# Patient Record
Sex: Female | Born: 1985 | Race: Black or African American | Hispanic: No | Marital: Single | Smoking: Never smoker
Health system: Southern US, Community
[De-identification: ages and names within clinical notes are randomized; demographics above are authoritative.]

## PROBLEM LIST (undated history)

## (undated) DIAGNOSIS — O24419 Gestational diabetes mellitus in pregnancy, unspecified control: Secondary | ICD-10-CM

## (undated) HISTORY — PX: NO PAST SURGERIES: SHX2092

---
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
## 2007-08-29 ENCOUNTER — Emergency Department (HOSPITAL_COMMUNITY): Admission: EM | Admit: 2007-08-29 | Discharge: 2007-08-29 | Payer: Self-pay | Admitting: Emergency Medicine

## 2007-11-02 ENCOUNTER — Ambulatory Visit (HOSPITAL_COMMUNITY): Admission: RE | Admit: 2007-11-02 | Discharge: 2007-11-02 | Payer: Self-pay | Admitting: Family Medicine

## 2007-12-01 ENCOUNTER — Ambulatory Visit (HOSPITAL_COMMUNITY): Admission: RE | Admit: 2007-12-01 | Discharge: 2007-12-01 | Payer: Self-pay | Admitting: Family Medicine

## 2007-12-08 ENCOUNTER — Ambulatory Visit (HOSPITAL_COMMUNITY): Admission: RE | Admit: 2007-12-08 | Discharge: 2007-12-08 | Payer: Self-pay | Admitting: Family Medicine

## 2007-12-08 ENCOUNTER — Telehealth (INDEPENDENT_AMBULATORY_CARE_PROVIDER_SITE_OTHER): Payer: Self-pay | Admitting: Family Medicine

## 2008-02-21 ENCOUNTER — Encounter: Admission: RE | Admit: 2008-02-21 | Discharge: 2008-04-10 | Payer: Self-pay | Admitting: Obstetrics & Gynecology

## 2008-02-21 ENCOUNTER — Ambulatory Visit: Payer: Self-pay | Admitting: Obstetrics & Gynecology

## 2008-02-22 ENCOUNTER — Inpatient Hospital Stay (HOSPITAL_COMMUNITY): Admission: AD | Admit: 2008-02-22 | Discharge: 2008-02-22 | Payer: Self-pay | Admitting: Gynecology

## 2008-02-28 ENCOUNTER — Ambulatory Visit: Payer: Self-pay | Admitting: Obstetrics & Gynecology

## 2008-03-06 ENCOUNTER — Ambulatory Visit (HOSPITAL_COMMUNITY): Admission: RE | Admit: 2008-03-06 | Discharge: 2008-03-06 | Payer: Self-pay | Admitting: Family Medicine

## 2008-03-06 ENCOUNTER — Ambulatory Visit: Payer: Self-pay | Admitting: Obstetrics & Gynecology

## 2008-03-13 ENCOUNTER — Ambulatory Visit: Payer: Self-pay | Admitting: Obstetrics & Gynecology

## 2008-03-20 ENCOUNTER — Ambulatory Visit: Payer: Self-pay | Admitting: *Deleted

## 2008-03-27 ENCOUNTER — Ambulatory Visit: Payer: Self-pay | Admitting: Obstetrics & Gynecology

## 2008-04-03 ENCOUNTER — Ambulatory Visit: Payer: Self-pay | Admitting: Family Medicine

## 2008-04-10 ENCOUNTER — Ambulatory Visit: Payer: Self-pay | Admitting: Obstetrics & Gynecology

## 2008-04-17 ENCOUNTER — Ambulatory Visit: Payer: Self-pay | Admitting: Obstetrics & Gynecology

## 2008-04-24 ENCOUNTER — Encounter: Admission: RE | Admit: 2008-04-24 | Discharge: 2008-04-24 | Payer: Self-pay | Admitting: Obstetrics & Gynecology

## 2008-04-24 ENCOUNTER — Ambulatory Visit: Payer: Self-pay | Admitting: Obstetrics & Gynecology

## 2008-04-25 ENCOUNTER — Ambulatory Visit (HOSPITAL_COMMUNITY): Admission: RE | Admit: 2008-04-25 | Discharge: 2008-04-25 | Payer: Self-pay | Admitting: Obstetrics and Gynecology

## 2008-04-30 ENCOUNTER — Ambulatory Visit: Payer: Self-pay | Admitting: Obstetrics & Gynecology

## 2008-04-30 ENCOUNTER — Observation Stay (HOSPITAL_COMMUNITY): Admission: AD | Admit: 2008-04-30 | Discharge: 2008-04-30 | Payer: Self-pay | Admitting: Obstetrics & Gynecology

## 2008-05-01 ENCOUNTER — Ambulatory Visit: Payer: Self-pay | Admitting: Obstetrics and Gynecology

## 2008-05-01 ENCOUNTER — Inpatient Hospital Stay (HOSPITAL_COMMUNITY): Admission: AD | Admit: 2008-05-01 | Discharge: 2008-05-04 | Payer: Self-pay | Admitting: Obstetrics & Gynecology

## 2008-05-02 ENCOUNTER — Encounter: Payer: Self-pay | Admitting: Obstetrics & Gynecology

## 2008-11-11 ENCOUNTER — Emergency Department (HOSPITAL_COMMUNITY): Admission: EM | Admit: 2008-11-11 | Discharge: 2008-11-11 | Payer: Self-pay | Admitting: Emergency Medicine

## 2009-09-03 IMAGING — CR DG HAND COMPLETE 3+V*R*
3 series · 3 of 3 positions shown · non-contrast
Comparison: none

CLINICAL DATA: Right hand pain. No injury.

RIGHT HAND - 3 VIEW

[view not recorded (1 of 3)]
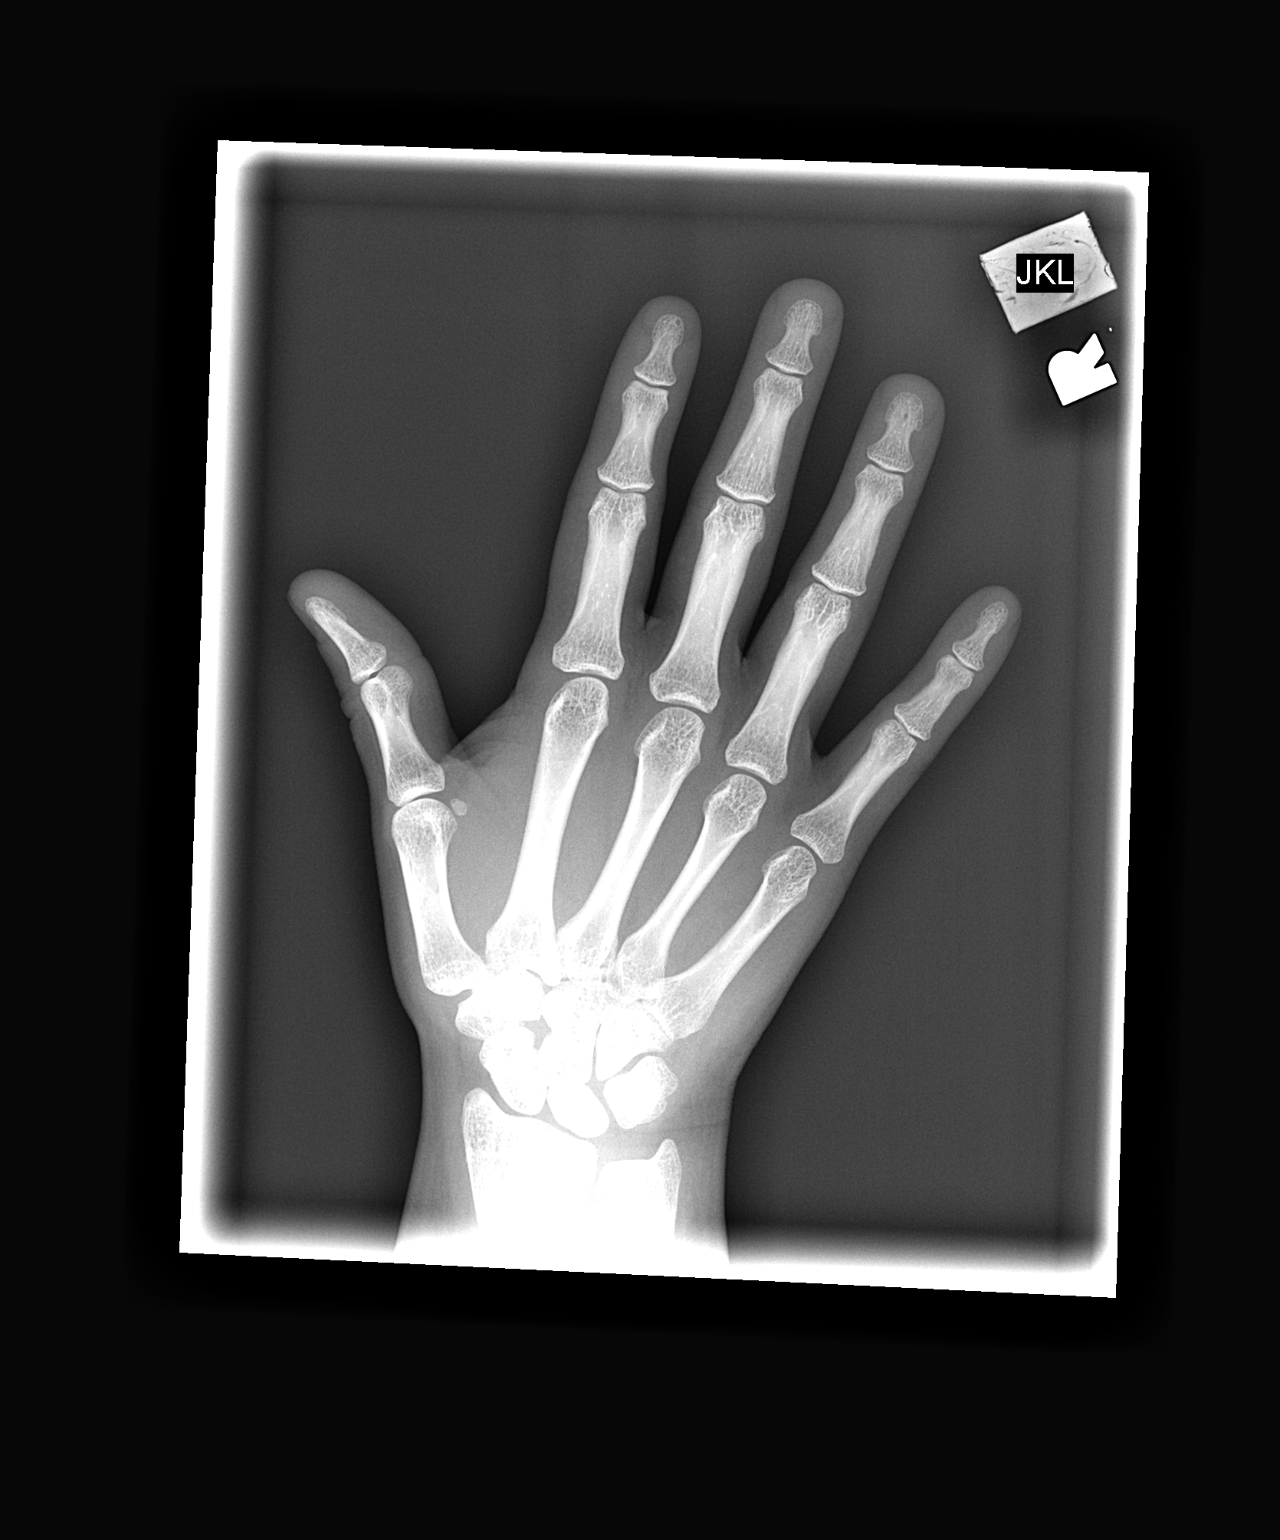

[view not recorded (2 of 3)]
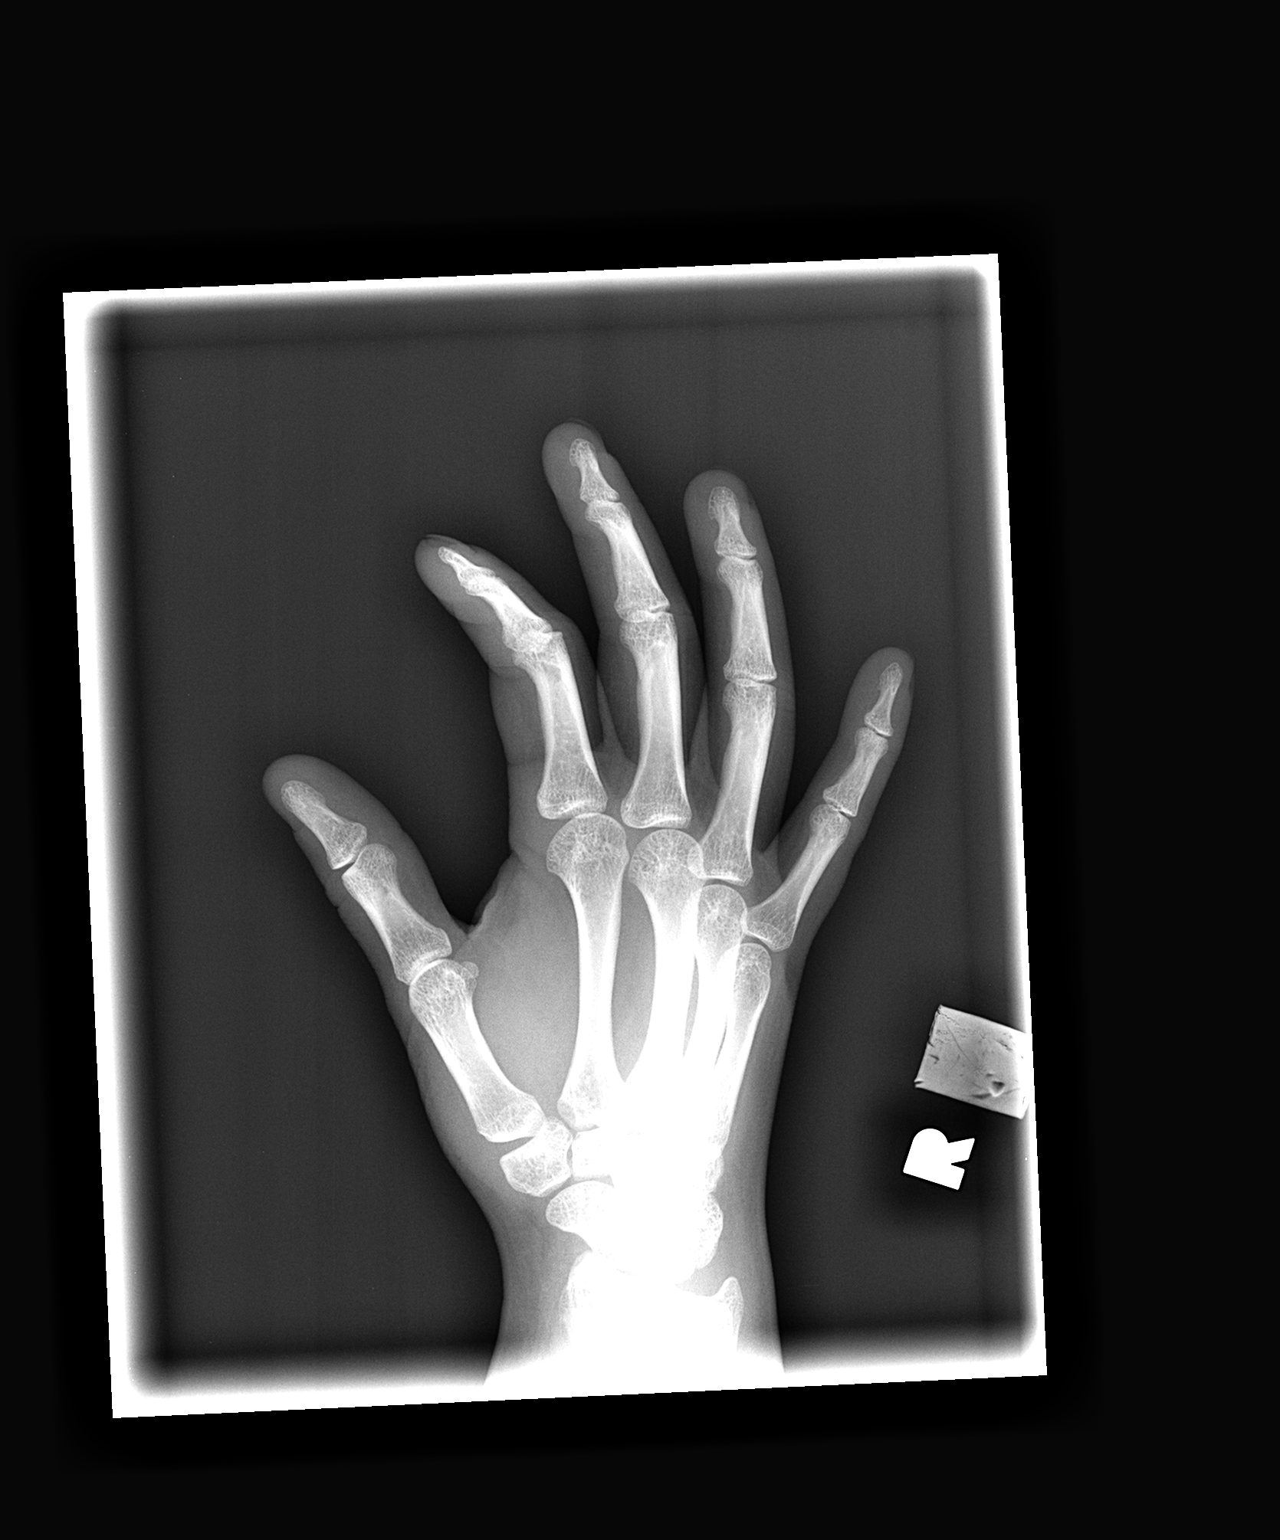

[view not recorded (3 of 3)]
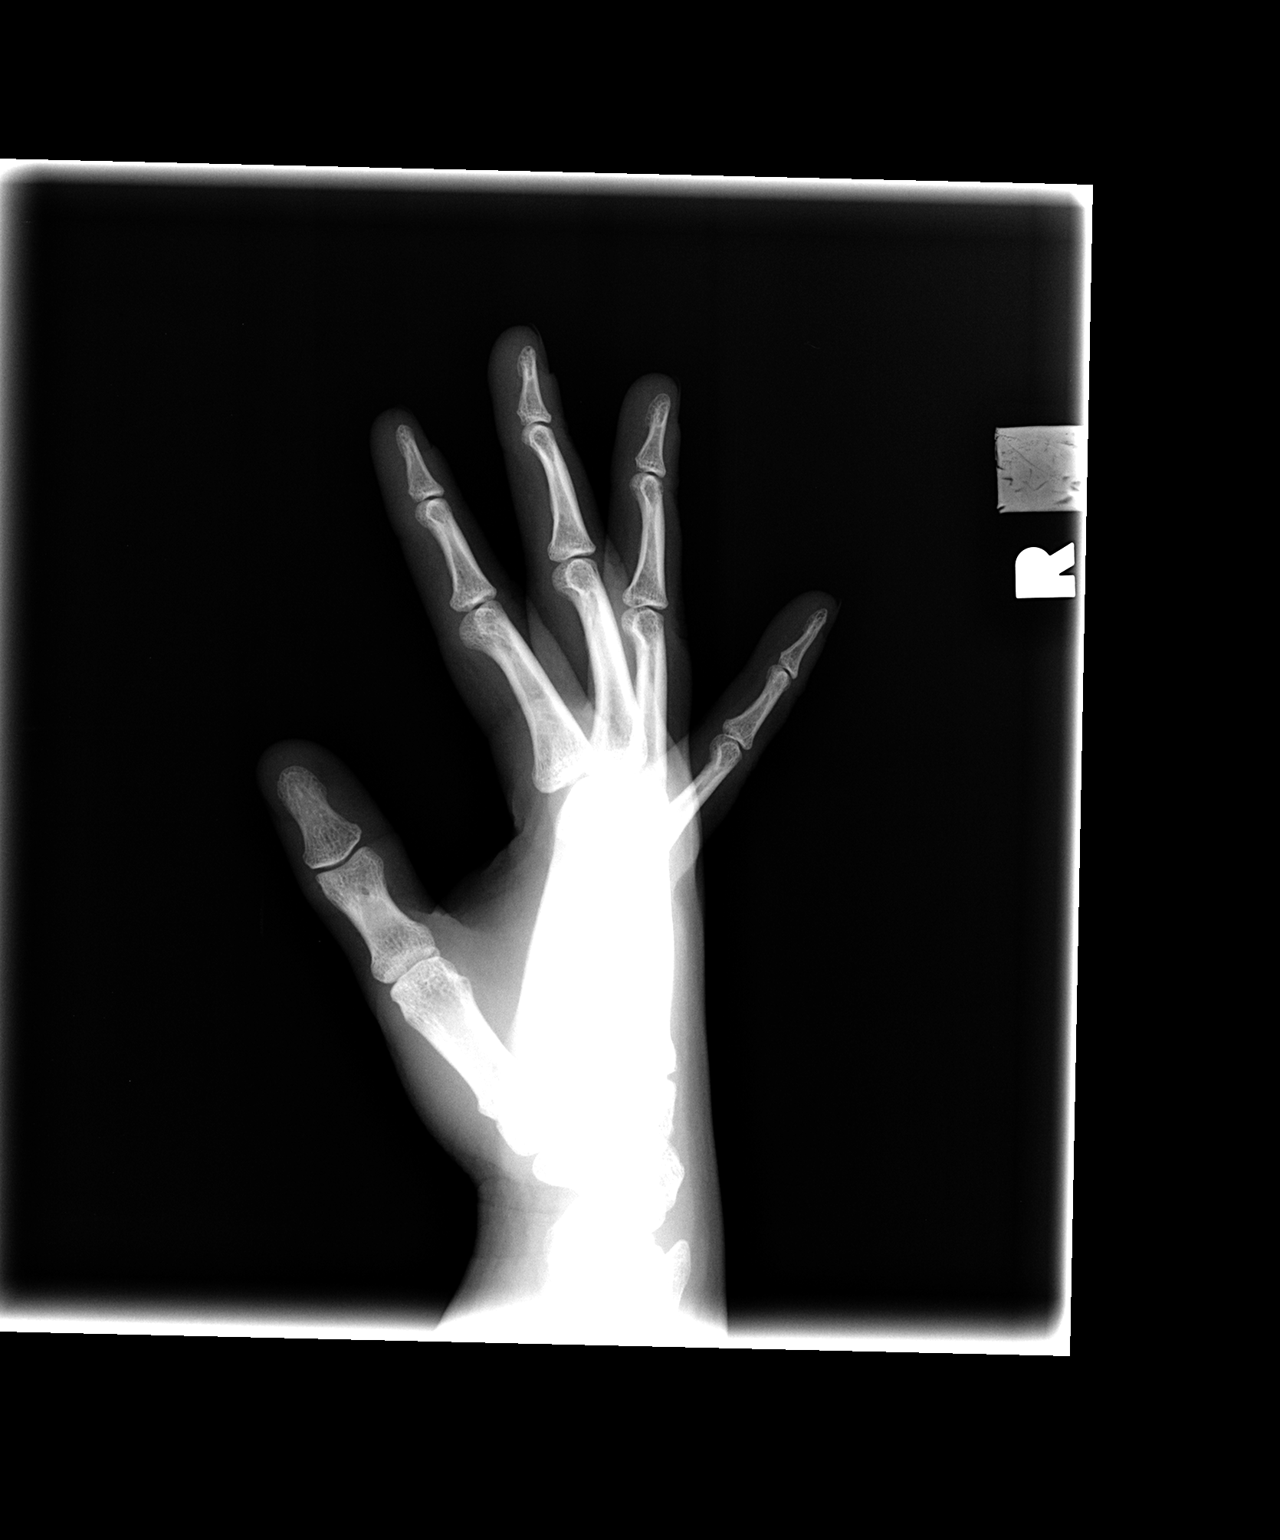

[3 of 3 positions shown; findings below may reference images not displayed]

FINDINGS: No fracture, dislocation, or acute bony findings are identified. No
foreign body noted. No significant arthropathy or findings identified to explain
patient's right hand pain.

IMPRESSION

No acute findings.

## 2009-09-24 ENCOUNTER — Ambulatory Visit: Payer: Self-pay | Admitting: Family Medicine

## 2009-09-24 ENCOUNTER — Encounter: Payer: Self-pay | Admitting: Family Medicine

## 2009-09-24 DIAGNOSIS — E119 Type 2 diabetes mellitus without complications: Secondary | ICD-10-CM

## 2009-09-24 DIAGNOSIS — E785 Hyperlipidemia, unspecified: Secondary | ICD-10-CM | POA: Insufficient documentation

## 2009-09-24 DIAGNOSIS — R631 Polydipsia: Secondary | ICD-10-CM

## 2009-09-24 DIAGNOSIS — R3589 Other polyuria: Secondary | ICD-10-CM | POA: Insufficient documentation

## 2009-09-24 DIAGNOSIS — R358 Other polyuria: Secondary | ICD-10-CM

## 2009-09-24 LAB — CONVERTED CEMR LAB
Bilirubin Urine: NEGATIVE
Blood in Urine, dipstick: NEGATIVE
Specific Gravity, Urine: 1.01
WBC Urine, dipstick: NEGATIVE

## 2009-09-25 ENCOUNTER — Encounter: Payer: Self-pay | Admitting: Family Medicine

## 2009-09-25 ENCOUNTER — Encounter: Payer: Self-pay | Admitting: *Deleted

## 2009-09-25 LAB — CONVERTED CEMR LAB
ALT: 8 units/L (ref 0–35)
BUN: 9 mg/dL (ref 6–23)
CO2: 18 meq/L — ABNORMAL LOW (ref 19–32)
Cholesterol: 301 mg/dL — ABNORMAL HIGH (ref 0–200)
Creatinine, Ser: 0.62 mg/dL (ref 0.40–1.20)
Glucose, Bld: 355 mg/dL — ABNORMAL HIGH (ref 70–99)
HCT: 42.3 % (ref 36.0–46.0)
HDL: 42 mg/dL (ref >39)
MCV: 84.6 fL (ref 78.0–100.0)
Platelets: 312 10*3/uL (ref 150–400)
Total Bilirubin: 0.6 mg/dL (ref 0.3–1.2)
Total CHOL/HDL Ratio: 7.2
Triglycerides: 230 mg/dL — ABNORMAL HIGH (ref <150)
VLDL: 46 mg/dL — ABNORMAL HIGH (ref 0–40)
WBC: 12.1 10*3/uL — ABNORMAL HIGH (ref 4.0–10.5)

## 2009-10-01 ENCOUNTER — Ambulatory Visit: Payer: Self-pay | Admitting: Family Medicine

## 2009-10-08 ENCOUNTER — Telehealth: Payer: Self-pay | Admitting: Family Medicine

## 2009-10-12 ENCOUNTER — Ambulatory Visit: Payer: Self-pay | Admitting: Family Medicine

## 2009-11-12 ENCOUNTER — Encounter: Payer: Self-pay | Admitting: *Deleted

## 2009-11-20 ENCOUNTER — Ambulatory Visit: Payer: Self-pay | Admitting: Family Medicine

## 2010-03-12 IMAGING — US US OB FOLLOW-UP
1 series · 14 of 25 positions shown · non-contrast
Comparison: none

OBSTETRICAL ULTRASOUND:
 This ultrasound exam was performed in the [HOSPITAL] Ultrasound Department.  The OB US report was generated in the AS system, and faxed to the ordering physician.  This report is also available in [REDACTED] PACS.

[Series 1: us ob re-eval · 14 of 25 slices shown]
[im 1/25]
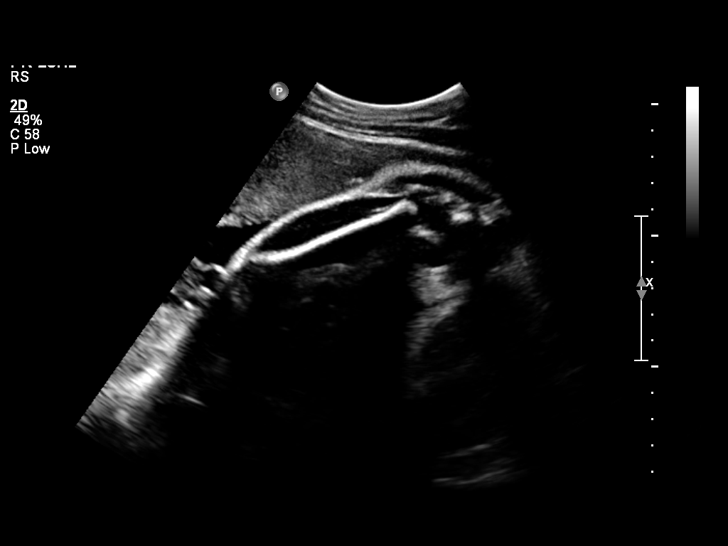
[im 3/25]
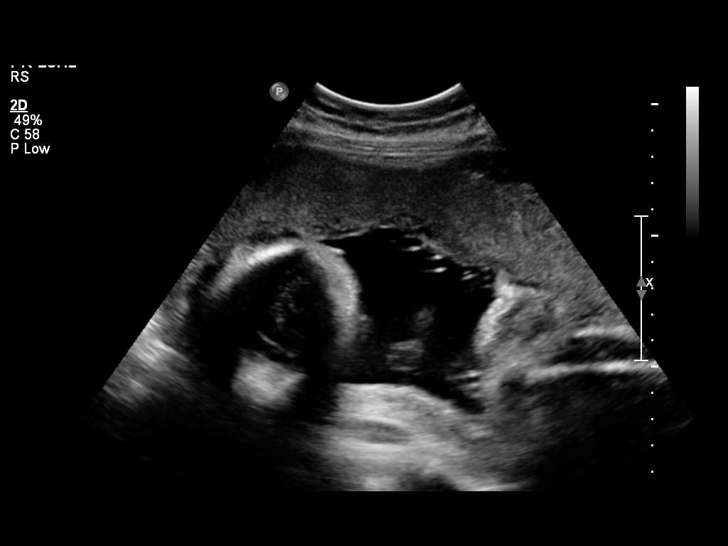
[im 5/25]
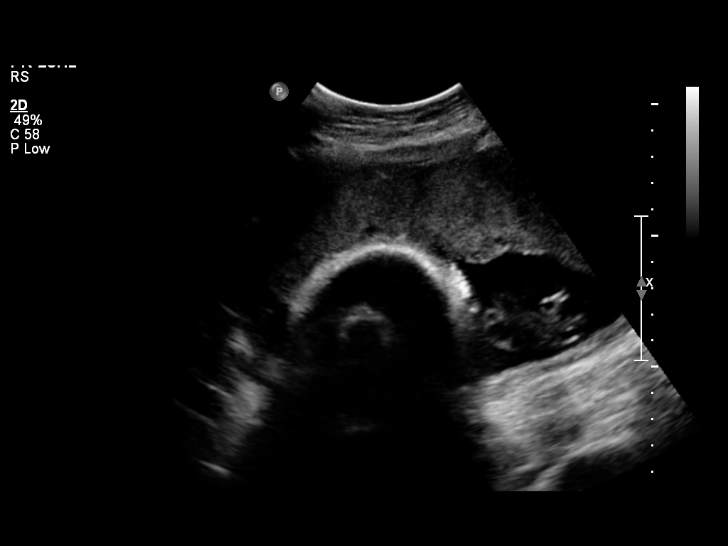
[im 7/25]
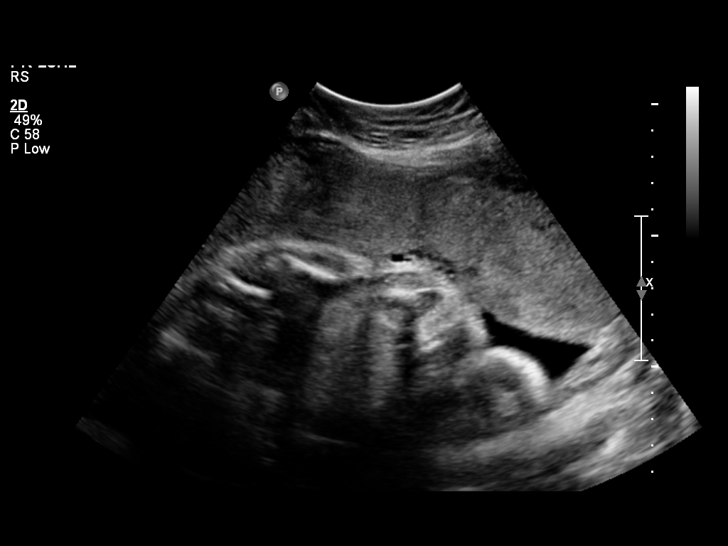
[im 9/25]
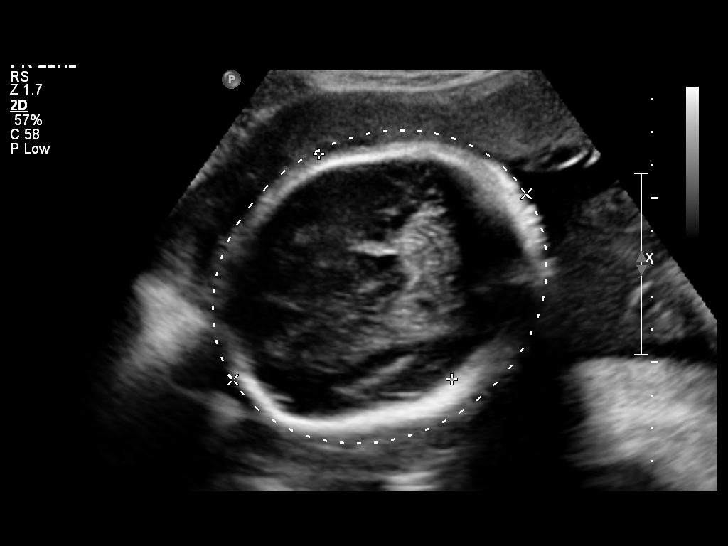
[im 10/25]
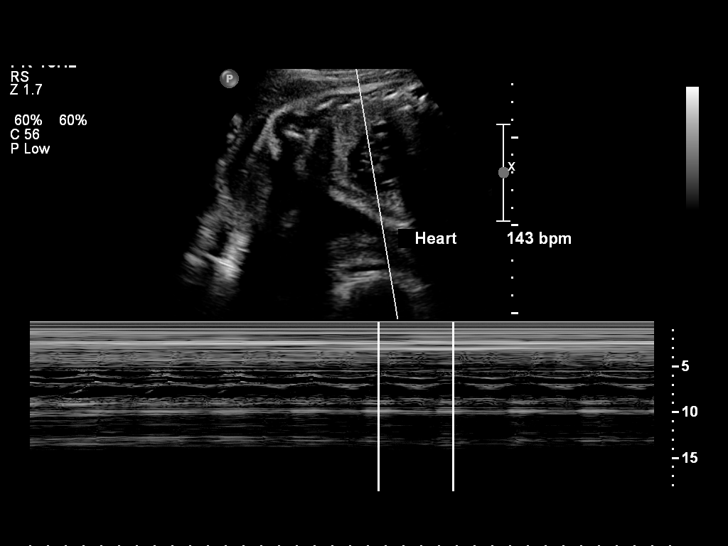
[im 12/25]
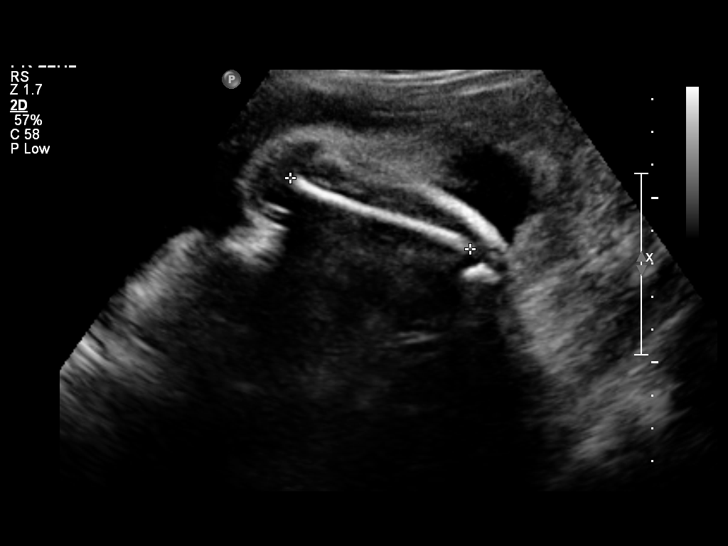
[im 14/25]
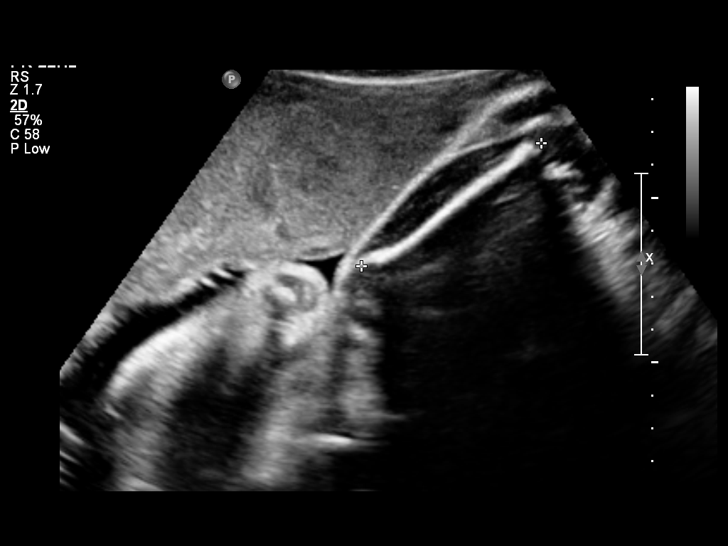
[im 16/25]
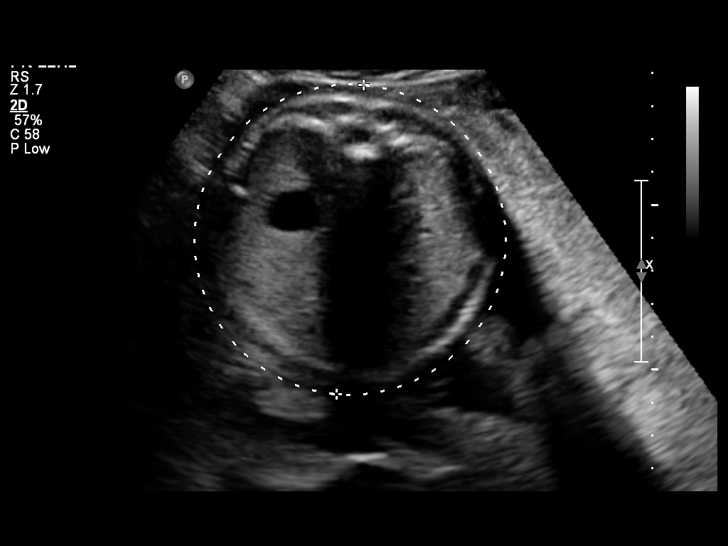
[im 17/25]
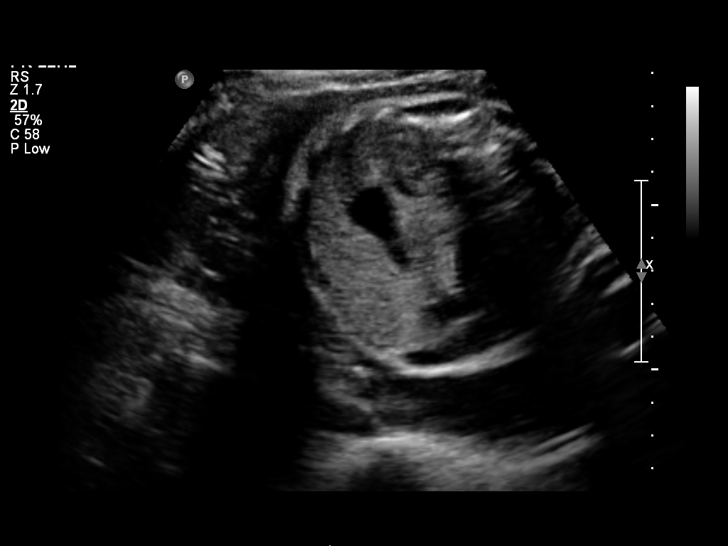
[im 19/25]
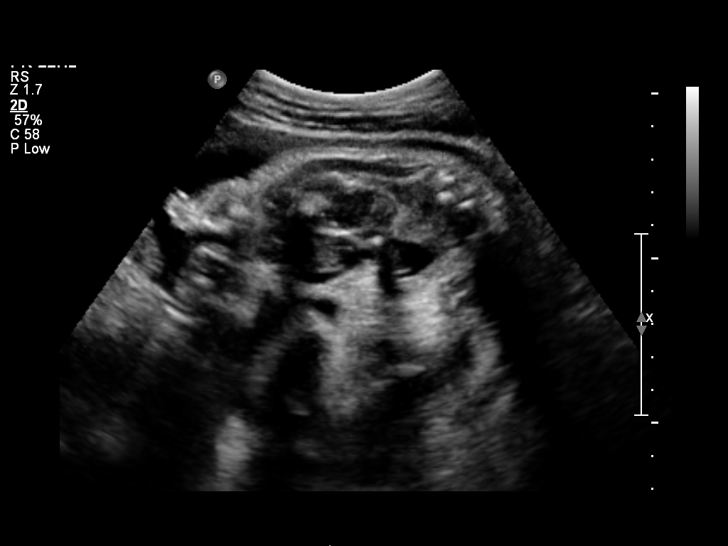
[im 21/25]
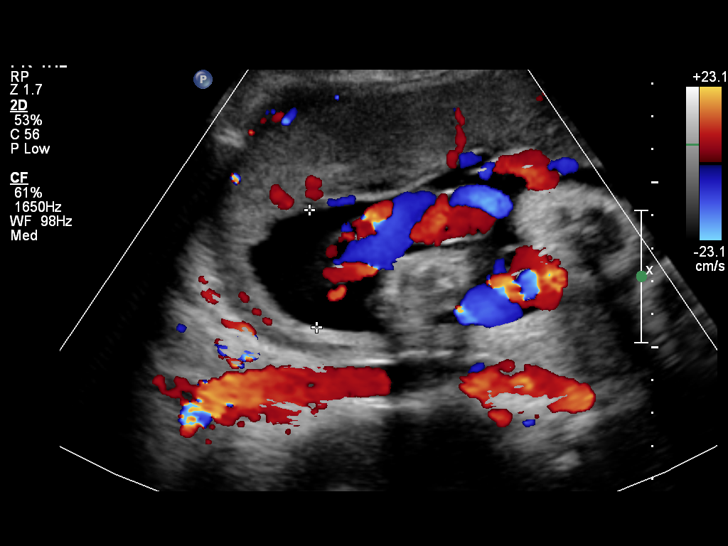
[im 23/25]
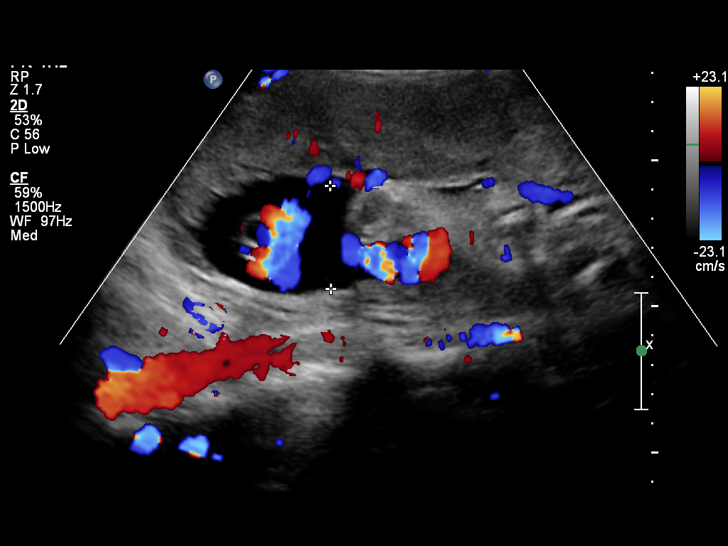
[im 25/25]
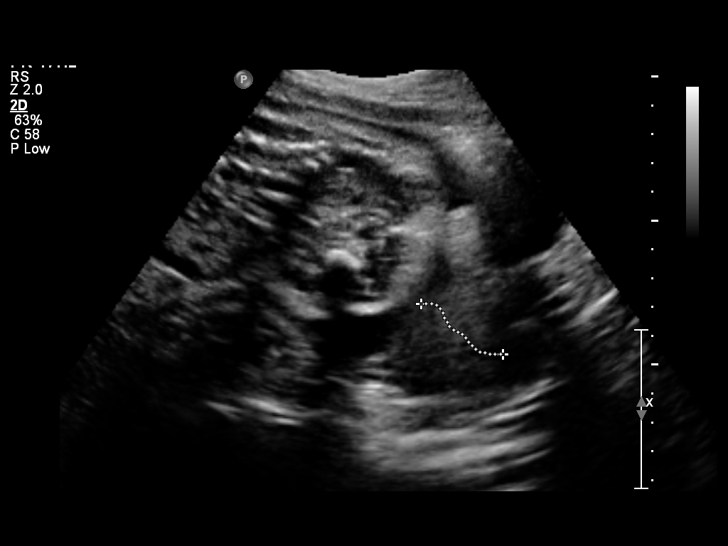

[14 of 25 positions shown; findings below may reference images not displayed]

IMPRESSION: See AS Obstetric US report.

## 2010-03-18 ENCOUNTER — Encounter: Payer: Self-pay | Admitting: Family Medicine

## 2010-09-23 ENCOUNTER — Encounter: Payer: Self-pay | Admitting: *Deleted

## 2010-10-01 NOTE — Miscellaneous (Signed)
Summary: re: missed appt/ts  [facility] called pt. pt reports that she was unable to come in due to work schedule. pt promised that she will make new appt and keep it next week after checking her schedule. pt also has upcoming appt at the diabetic ctr and she will keep the appt to get her blood sugar better under control. (appt 11-19-09) fwd. to s.saxon for review.Arlyss Repress CMA,  November 12, 2009 10:33 AM

## 2010-10-01 NOTE — Miscellaneous (Signed)
Summary: re: letter/TS  [facility] pt's phone number is d/c. mailed letter and also faxed order to the Diab. outpt. Ctr. original order and instructions put in pt's letter, so she can call for an appt.Arlyss Repress CMA,  September 25, 2009 10:38 AM

## 2010-10-01 NOTE — Assessment & Plan Note (Signed)
Summary: f/up,tcb   Vital Signs:  Patient profile:   25 year old female Height:      60 inches Weight:      147 pounds BMI:     28.81 Pulse rate:   96 / minute BP sitting:   122 / 77  (left arm) Cuff size:   regular  Vitals Entered By: Tessie Fass CMA (November 20, 2009 9:08 AM) CC: f/u diabetes Is Patient Diabetic? Yes Pain Assessment Patient in pain? no        CC:  f/u diabetes.  History of Present Illness: Taylor [name] still had not picked up her glucose monitor and has not attended classes.  When asked about birth control, she got very quiet, and reveling that she would like to get pregnant.  Newly diagnosed diabetes, has not yet gained control.  Recommended OCPs and discussed risks of pregnancy. She feels that she can get pregnant and handle the diabetes.  She denies polyuria or polydipsia.  Elevated lipids but of child bearing age making statins contraindicated, especially with her goal to get pregnant.  Habits & Providers  Alcohol-Tobacco-Diet     Tobacco Status: never  Current Medications (verified): 1)  Metformin Hcl 1000 Mg Tabs (Metformin Hcl) .... One Two Times A Day (Dosage Change) 2)  Amaryl 2 Mg Tabs (Glimepiride) .... One Daily 3)  Sprintec 28 0.25-35 Mg-Mcg Tabs (Norgestimate-Eth Estradiol) .... One Daily  Allergies (verified): No Known Drug Allergies  Review of Systems  The patient denies anorexia, weight loss, vision loss, peripheral edema, and abdominal pain.    Physical Exam  General:  Well-developed,well-nourished,in no acute distress; alert,appropriate and cooperative throughout examination Lungs:  normal respiratory effort and normal breath sounds.   Heart:  normal rate and no murmur.     Impression & Recommendations:  Problem # 1:  DIABETES MELLITUS (ICD-250.00) Newly diagnosed, A1C still very elevated.  Counseled to begin contracetpion and plan pregnancy after she gets DM under control.  She was not open to this option.  Increase SU to 2  mg. Her updated medication list for this problem includes:    Metformin Hcl 1000 Mg Tabs (Metformin hcl) ..... One two times a day (dosage change)    Amaryl 2 Mg Tabs (Glimepiride) ..... One daily  Orders: A1C-[facility] ([address]63) Glucose Cap-[facility] (87564) [facility]- Est  Level 4 (33295)  Problem # 2:  HYPERLIPIDEMIA (ICD-272.4)  Childbearing age, wants to get pregnant, statin contraindicated at this time  Orders: [facility]- Est  Level 4 (18841)  Problem # 3:  CONTRACEPTIVE MANAGEMENT (ICD-V25.09) Strongly encouraged to begin OCP, have my doubts this will happen. Orders: U Preg-[facility] (81025) [facility]- Est  Level 4 (99214)  Complete Medication List: 1)  Metformin Hcl 1000 Mg Tabs (Metformin hcl) .... One two times a day (dosage change) 2)  Amaryl 2 Mg Tabs (Glimepiride) .... One daily 3)  Sprintec 28 0.25-35 Mg-mcg Tabs (Norgestimate-eth estradiol) .... One daily Prescriptions: SPRINTEC 28 0.25-35 MG-MCG TABS (NORGESTIMATE-ETH ESTRADIOL) one daily  #1 x 11   Entered and Authorized by:   Luretha Murphy NP   Signed by:   Luretha Murphy NP on 11/20/2009   Method used:   Electronically to        [facility] [phone]* (retail)       [address]       [place], [place]  [address]       Ph: [phone]       Fax: [phone]   RxID:   [id number] [id number]  2 MG TABS (GLIMEPIRIDE) one daily  #30 x 11   Entered and Authorized by:   Luretha Murphy NP   Signed by:   Luretha Murphy NP on 11/20/2009   Method used:   Electronically to        [facility] [phone]* (retail)       [address]       [place], [place]  [address]       Ph: [phone]       Fax: [phone]   RxID:   [id number] METFORMIN HCL 1000 MG TABS (METFORMIN HCL) one two times a day (dosage change)  #60 x 11   Entered and Authorized by:   Luretha Murphy NP   Signed by:   Luretha Murphy NP on 11/20/2009   Method used:   Electronically to        [facility] [phone]* (retail)       [address]       [place], [place]  [address]        Ph: [phone]       Fax: [phone]   RxID:   [id number]   Laboratory Results   Urine Tests  Date/Time Received: November 20, 2009 9:26 AM  Date/Time Reported: November 20, 2009 9:43 AM     Urine HCG: negative Comments: ...........test performed by...........Marland KitchenTerese Door, CMA    Blood Tests   Date/Time Received: November 20, 2009 9:13 AM  Date/Time Reported: November 20, 2009 9:33 AM   HGBA1C: 12.9%   (Normal Range: Non-Diabetic - 3-6%   Control Diabetic - 6-8%)  Comments: ...............test performed by......Marland KitchenBonnie A. [place], MLS (ASCP)cm

## 2010-10-01 NOTE — Progress Notes (Signed)
Summary: referral prob  Phone Note Call from Patient Call back at [phone]   Caller: Patient Summary of Call: states that [facility] did not get info for the referral - see previous notes. Initial call taken by: De Nurse,  October 08, 2009 9:28 AM  Follow-up for Phone Call        will start process of referral and will fax to [facility]. will forward message to Luretha Murphy to enter in referral order . Follow-up by: Theresia Lo RN,  October 08, 2009 10:16 AM  Additional Follow-up for Phone Call Additional follow up Details #1::        form completed, also done 2 weeks ago. Additional Follow-up by: Luretha Murphy NP,  October 08, 2009 4:49 PM

## 2010-10-01 NOTE — Assessment & Plan Note (Signed)
Summary: f/up,tcb   Vital Signs:  Patient profile:   25 year old female Height:      60 inches Weight:      156.8 pounds BMI:     30.73 Pulse rate:   84 / minute BP sitting:   130 / 89  (right arm)  Vitals Entered By: Arlyss Repress CMA, (October 01, 2009 8:44 AM) CC: f/up DM Is Patient Diabetic? Yes Pain Assessment Patient in pain? no        CC:  f/up DM.  History of Present Illness: Patient in office for follow up visit to manage Diabetes.  Reports she has been taking Metformin as prescribed without adverse effects(GI).  States she has attempted to initiate healthy diet at home as she has not had visit with Diabetes Management but admits to need for further education.  Reports polyuria and polydipsia is improved.  24 hour recall diet provided.  Never exposed to healthy eating, uses fast food options often.  Drinks a lot of calories.  She is upset about this diagnosis, she was ready to begin work and her employer has placed this on hold.  Current Medications (verified): 1)  Metformin Hcl 1000 Mg Tabs (Metformin Hcl) .... One Two Times A Day (Dosage Change)  Allergies (verified): No Known Drug Allergies  Review of Systems General:  Denies fatigue, fever, loss of appetite, and weakness. CV:  Denies chest pain or discomfort, shortness of breath with exertion, swelling of feet, and swelling of hands. Resp:  Denies chest discomfort and shortness of breath. GI:  Denies abdominal pain, diarrhea, nausea, and vomiting. GU:  Denies dysuria and urinary frequency. Endo:  Denies excessive thirst and polyuria.  Physical Exam  General:  Well-developed,well-nourished,in no acute distress; alert,appropriate and cooperative throughout examination Chest Wall:  No deformities, masses, or tenderness noted. Breasts:  No mass, nodules, thickening, tenderness, bulging, retraction, inflamation, nipple discharge or skin changes noted.   Lungs:  Normal respiratory effort, chest expands  symmetrically. Lungs are clear to auscultation, no crackles or wheezes. Heart:  Normal rate and regular rhythm. S1 and S2 normal without gallop, murmur, click, rub or other extra sounds. Abdomen:  Bowel sounds positive,abdomen soft and non-tender without masses, organomegaly or hernias noted.   Impression & Recommendations:  Problem # 1:  DIABETES MELLITUS (ICD-250.00) Follow up visit for diabetes management, education provided in regards to diet and exercise, Metformin increased to 1000mg  by mouth two times a day, patient to follow up with diabetes management for further diet management and glucose monitoring teaching.  Will see in office in 1-2 weeks. Her updated medication list for this problem includes:    Metformin Hcl 1000 Mg Tabs (Metformin hcl) ..... One two times a day (dosage change)  Orders: Glucose Cap-[facility] ([address]) [facility]- Est Level  3 ([address])  Problem # 2:  HYPERLIPIDEMIA (ICD-272.4)  Will address, but not on any permanent birth control at this time.    Orders: [facility]- Est Level  3 (09811)  Complete Medication List: 1)  Metformin Hcl 1000 Mg Tabs (Metformin hcl) .... One two times a day (dosage change)  Patient Instructions: 1)  Increase Metformin to 1000mg  two times a day.   2)  Follow healthy diet as discussed. 3)  Be careful with empty calories that are in most drinks. 4)  Follow up with Diabetes Management as discussed. 5)  Follow up in office in 1-2wks. Prescriptions: METFORMIN HCL 1000 MG TABS (METFORMIN HCL) one two times a day (dosage change)  #60 x 6  Entered and Authorized by:   Luretha Murphy NP   Signed by:   Luretha Murphy NP on 10/01/2009   Method used:   Electronically to        [facility]. [address] [phone]* (retail)       [address]       [place], [place]  [address]       Ph: [phone] or [phone]       Fax: [phone]   RxID:   [id number]

## 2010-10-01 NOTE — Assessment & Plan Note (Signed)
Summary: thinks she has DM,df   Vital Signs:  Patient profile:   25 year old female Height:      60 inches Weight:      153 pounds BMI:     29.99 Temp:     97.8 degrees F oral Pulse rate:   98 / minute BP sitting:   120 / 85  (left arm) Cuff size:   regular  Vitals Entered By: Tessie Fass CMA (September 24, 2009 8:41 AM) CC: check for diabetes Pain Assessment Patient in pain? no        CC:  check for diabetes.  History of Present Illness: Patient reports she had test done for a job she is seeking and was told she need to further evaluate a blood glucose in the 400's.  patient admits to history of polyuria and polydipsia over the last month.  Dx with gestational diabetes in 2009 when she was pregnant with child.  Reports she was never placed on medication but was instructed to change diet and exercise. Denies recent hx of abdominal pain, nausea, vomiting or diarrhea.    Habits & Providers  Alcohol-Tobacco-Diet     Tobacco Status: never  Current Medications (verified): 1)  None  Allergies (verified): No Known Drug Allergies  Past History:  Past Medical History: Gardasil #1` 09/25/06, One abnormal PAP at health dept-repeat 3/08 Gestational diabetes  Family History: Myocardial infarction-father Multiple sclerosis-mother CAD-MGM  Social History: Smoking Status:  never  Review of Systems General:  Denies chills, fatigue, fever, loss of appetite, malaise, and weakness. CV:  Denies chest pain or discomfort, fatigue, shortness of breath with exertion, swelling of feet, and swelling of hands. Resp:  Denies chest discomfort, cough, and shortness of breath. GI:  Denies abdominal pain, diarrhea, loss of appetite, nausea, and vomiting. GU:  Complains of urinary frequency; denies dysuria.  Physical Exam  General:  Well-developed,well-nourished,in no acute distress; alert,appropriate and cooperative throughout examination Mouth:  Oral mucosa and oropharynx without  lesions or exudates.  Teeth in good repair. Chest Wall:  No deformities, masses, or tenderness noted. Lungs:  Normal respiratory effort, chest expands symmetrically. Lungs are clear to auscultation, no crackles or wheezes. Heart:  Normal rate and regular rhythm. S1 and S2 normal without gallop, murmur, click, rub or other extra sounds. Abdomen:  Bowel sounds positive,abdomen soft and non-tender without masses, organomegaly or hernias noted. Skin:  Intact without suspicious lesions or rashes    Impression & Recommendations:  Problem # 1:  DIABETES MELLITUS (ICD-250.00)  New Diagnosis patient with complaints of polyuria and polydipsia the last two months along with pmh of gestational diabetes, labs diagnostic of DM.  Begin metformin, life style changes, return in one week (originally told 2, but sent letter to come in sooner).   Her updated medication list for this problem includes:    Metformin Hcl 500 Mg Tabs (Metformin hcl) ..... One tab two times a day  Orders: [facility]- Est  Level 4 (91478)  Problem # 2:  HYPERLIPIDEMIA (ICD-272.4)  Patient is 25 years old and still of child bearing years.  Will attempt to [facility] with omega 3s, and life style changes.  Statin contraindicated at this time, as she is not on permanent birth control.  Orders: [facility]- Est  Level 4 (29562)  Complete Medication List: 1)  Metformin Hcl 500 Mg Tabs (Metformin hcl) .... One tab two times a day  Other Orders: Comp Met-[facility] [phone]) Lipid-[facility] (96[address]-28413) Glucose Cap-[facility] (24401) Urinalysis-[facility] (00000) CBC-[facility] (02725) A1C-[facility] (36644)  Patient Instructions: 1)  We will obtain lab values today.   2)  Take medication as prescribed. 3)  I have sent a referral for diabetes outpatient management for further counseling. 4)  As discussed diet and exercise are the best interventions you can implement at home. 5)  Follow up in office in two weeks so that we can make sure plan is working for  you. Prescriptions: METFORMIN HCL 500 MG TABS (METFORMIN HCL) one tab two times a day  #60 x 3   Entered and Authorized by:   Luretha Murphy NP   Signed by:   Luretha Murphy NP on 09/25/2009   Method used:   Electronically to        [facility] [address] [phone]* (retail)       [address]       [place], [place]  [address]       Ph: [phone] or [phone]       Fax: [phone]   RxID:   [id number] METFORMIN HCL 500 MG TABS (METFORMIN HCL) take 1/2 tab two times a day for one week then one tab two times a day  #60 x 2   Entered and Authorized by:   Luretha Murphy NP   Signed by:   Arlyss Repress CMA, on 09/24/2009   Method used:   Electronically to        [facility] [address] [phone]* (retail)       [address]       [place], [place]  [address]       Ph: [phone] or [phone]       Fax: [phone]   RxID:   [id number]   Laboratory Results   Urine Tests  Date/Time Received: September 24, 2009 9:02 AM  Date/Time Reported: September 24, 2009 9:26 AM   Routine Urinalysis   Color: yellow Appearance: Clear Glucose: 500   (Normal Range: Negative) Bilirubin: negative   (Normal Range: Negative) Ketone: large (80)   (Normal Range: Negative) Spec. Gravity: 1.010   (Normal Range: 1.003-1.035) Blood: negative   (Normal Range: Negative) pH: 5.0   (Normal Range: 5.0-8.0) Protein: negative   (Normal Range: Negative) Urobilinogen: 0.2   (Normal Range: 0-1) Nitrite: negative   (Normal Range: Negative) Leukocyte Esterace: negative   (Normal Range: Negative)    Comments: ...........test performed by...........Marland KitchenTerese Door, CMA   Blood Tests   Date/Time Received: September 24, 2009 9:02 AM  Date/Time Reported: September 24, 2009 9:33 AM   HGBA1C: >14.0%   (Normal Range: Non-Diabetic - 3-6%   Control Diabetic - 6-8%)  Comments: ...........test performed by...........Marland KitchenTerese Door, CMA

## 2010-10-01 NOTE — Letter (Signed)
Summary: Generic Letter  [facility]  [address]   [place], [place] [address]   Phone: [phone]  Fax: [phone]    03/18/2010  Taylor [name] [address] [place], [place]  [address]  Dear [name],  You have not been in since March and it is very important for [place] to follow your newly diagnosed diabetes.  Diabetes can be very hard to manage and requires close monitoring.  Please make an appointment as soon as possible.     Sincerely,   Luretha Murphy NP  Appended Document: Generic Letter mailed

## 2010-10-01 NOTE — Assessment & Plan Note (Signed)
Summary: FU/KH   Vital Signs:  Patient profile:   25 year old female Height:      60 inches Weight:      153 pounds BMI:     29.99 Temp:     98.4 degrees F oral Pulse rate:   89 / minute BP sitting:   116 / 79  (left arm) Cuff size:   regular  Vitals Entered By: Arlyss Repress CMA, (October 12, 2009 8:57 AM) CC: F/U dibetes Is Patient Diabetic? Yes Pain Assessment Patient in pain? no        CC:  F/U dibetes.  History of Present Illness: Follow up for diabetes management.  Reports she has been taking Metformin as prescribed and symptoms of polyuria and polydipsia has improved.  Denies abdominal pain, nausea and vomiting or diarrhea.  Has not had the opportunity to meet with Diabetes management, her appt is on Feb 23rd. States she has been attempting to change diet and has stopped drinking tea.  Has not began exercising related to recent move but plans on exercising today.  24 hour diet recall provided and reviewed.   Patient also reports she wishes to work but is unable until cleared from physician.  Habits & Providers  Alcohol-Tobacco-Diet     Tobacco Status: never  Current Medications (verified): 1)  Metformin Hcl 1000 Mg Tabs (Metformin Hcl) .... One Two Times A Day (Dosage Change) 2)  Amaryl 1 Mg Tabs (Glimepiride) .... Take One By Mouth Each Morning  Allergies (verified): No Known Drug Allergies  Review of Systems General:  Denies fatigue, fever, malaise, sweats, and weakness. CV:  Denies chest pain or discomfort, difficulty breathing at night, fainting, fatigue, palpitations, swelling of feet, and swelling of hands. Resp:  Denies chest discomfort, cough, and shortness of breath. GI:  Denies abdominal pain, change in bowel habits, nausea, and vomiting. GU:  Denies dysuria and urinary frequency. Endo:  Denies excessive hunger, excessive thirst, and polyuria.  Physical Exam  General:  Well-developed,well-nourished,in no acute distress; alert,appropriate and  cooperative throughout examination Eyes:  vision grossly intact, pupils equal, pupils round, and pupils reactive to light.   Chest Wall:  No deformities, masses, or tenderness noted. Lungs:  Normal respiratory effort, chest expands symmetrically. Lungs are clear to auscultation, no crackles or wheezes. Heart:  Normal rate and regular rhythm. S1 and S2 normal without gallop, murmur, click, rub or other extra sounds. Pulses:  R and L carotid,radial,femoral,dorsalis pedis and posterior tibial pulses are full and equal bilaterally   Impression & Recommendations:  Problem # 1:  DIABETES MELLITUS (ICD-250.00) Continue with Metformin and add Amaryl as FBS was 227 today.  Information and education provided on hypoglycemia. Continue to make dietary changes and physical activity.   Patient to follow up with Diabetes Management as scheduled on Feb 23rd.  Letter sent to employer, safe to work.  Her updated medication list for this problem includes:    Metformin Hcl 1000 Mg Tabs (Metformin hcl) ..... One two times a day (dosage change)    Amaryl 1 Mg Tabs (Glimepiride) .Marland Kitchen... Take one by mouth each morning  Orders: Glucose Cap-[facility] (10626) Glucose Cap-[facility] (94854) [facility]- Est Level  3 (62703)  Problem # 2:  HYPERLIPIDEMIA (ICD-272.4)  Discussed, dietary modifications.   Orders: [facility]- Est Level  3 ([address]93)  Complete Medication List: 1)  Metformin Hcl 1000 Mg Tabs (Metformin hcl) .... One two times a day (dosage change) 2)  Amaryl 1 Mg Tabs (Glimepiride) .... Take one by mouth each  morning  Patient Instructions: 1)  Add Amaryl to pills you take in the morning, look for signs of hypoglycemia as discussed. 2)  Continue to take Metformin as previously ordered. 3)  Continue with diet modifications and physical activity. 4)  Follow up with Diabetes Management as scheduled on Feb. 23rd. 5)  Will send letter to employer for return to work. 6)  Return in one month for follow up. Prescriptions: AMARYL 1 MG  TABS (GLIMEPIRIDE) take one by mouth each morning  #30 x 2   Entered and Authorized by:   Luretha Murphy NP   Signed by:   Luretha Murphy NP on 10/12/2009   Method used:   Electronically to        [facility] [phone]* (retail)       [address]       [place], [place]  [address]       Ph: [phone]       Fax: [phone]   RxID:   [id number]

## 2010-10-01 NOTE — Letter (Signed)
Summary: Generic Letter  [facility]  [address]   [place], [place] [address]   Phone: [phone]  Fax: [phone]    09/25/2009  Jeanette MOREHEAD [address] [place], [place]  [address]  Dear Ms. MOREHEAD,   Candid, I am sorry I was sick and unable to talk to you.  I am very worried about your diabetes.  First I would like you to come in to see me next week and not wait for 2 weeks.  You must begin to eat healthy, limit your carbohydrates, do not eat or drink sugar at all, and limit fats (hot dogs, sausages, hamberger) in the form of animal meat.  You may eat lean [place], chicken, and fish, and a lot of veges.  You can have 2 pieces of fresh fruit daily.  I want you to walk, for 30 minutes twice daily, this will bring the blood sugar down.  Your blood sugars are very high and I am worried about you.  The nutrition/diabetic group will be calling you, you must attend these classes.  You have to learn how to test your blood sugar and you will likely be on more medication.  Go ahead an take a whole pill of the metformin twice daily, the 1/2 pill will not work with your high sugars.  I tried to call but we do not have a working number to reach you.  Please change that when you call in for an appointment.        Sincerely,   Luretha Murphy NP

## 2010-10-09 ENCOUNTER — Encounter: Payer: Self-pay | Admitting: *Deleted

## 2010-11-25 LAB — GLUCOSE, CAPILLARY: Glucose-Capillary: 258 mg/dL — ABNORMAL HIGH (ref 70–99)

## 2011-01-16 ENCOUNTER — Inpatient Hospital Stay (INDEPENDENT_AMBULATORY_CARE_PROVIDER_SITE_OTHER)
Admission: RE | Admit: 2011-01-16 | Discharge: 2011-01-16 | Disposition: A | Discharge status: Home / Self Care | Payer: Self-pay | Source: Ambulatory Visit | Attending: Family Medicine | Admitting: Family Medicine

## 2011-01-16 DIAGNOSIS — J069 Acute upper respiratory infection, unspecified: Secondary | ICD-10-CM

## 2011-04-01 ENCOUNTER — Inpatient Hospital Stay (HOSPITAL_COMMUNITY)
Admission: AD | Admit: 2011-04-01 | Discharge: 2011-04-01 | Discharge status: Against Medical Advice | Payer: Self-pay | Source: Ambulatory Visit | Attending: Family Medicine | Admitting: Family Medicine

## 2011-04-01 DIAGNOSIS — N949 Unspecified condition associated with female genital organs and menstrual cycle: Secondary | ICD-10-CM | POA: Insufficient documentation

## 2011-04-01 DIAGNOSIS — M549 Dorsalgia, unspecified: Secondary | ICD-10-CM | POA: Insufficient documentation

## 2011-04-01 DIAGNOSIS — N938 Other specified abnormal uterine and vaginal bleeding: Secondary | ICD-10-CM | POA: Insufficient documentation

## 2011-04-01 LAB — URINE MICROSCOPIC-ADD ON

## 2011-04-01 LAB — URINALYSIS, ROUTINE W REFLEX MICROSCOPIC
Leukocytes, UA: NEGATIVE
Nitrite: NEGATIVE
Specific Gravity, Urine: <1.005 — ABNORMAL LOW (ref 1.005–1.030)
Urobilinogen, UA: 0.2 mg/dL (ref 0.0–1.0)
pH: 5.5 (ref 5.0–8.0)

## 2011-04-01 NOTE — Initial Assessments (Signed)
Pt left AMA post-triage, set pager down on registration desk and walked out. Pt not in lobby at 1715 for lab draws, called at 1730, again at 1750.

## 2011-04-01 NOTE — Progress Notes (Signed)
Pt states lmp-03/16/2011, has had spotting intermittently, light brown vaginal d/c. Back pain intermittently since the weekend?, denies burning or urgency with voiding. +UPT this am, pt states was very faint.

## 2011-04-09 ENCOUNTER — Inpatient Hospital Stay (HOSPITAL_COMMUNITY)
Admission: AD | Admit: 2011-04-09 | Discharge: 2011-04-09 | Disposition: A | Discharge status: Home / Self Care | Payer: Self-pay | Source: Ambulatory Visit | Attending: Obstetrics and Gynecology | Admitting: Obstetrics and Gynecology

## 2011-04-09 ENCOUNTER — Other Ambulatory Visit: Payer: Self-pay

## 2011-04-09 ENCOUNTER — Inpatient Hospital Stay (HOSPITAL_COMMUNITY): Payer: Self-pay

## 2011-04-09 ENCOUNTER — Encounter (HOSPITAL_COMMUNITY): Payer: Self-pay | Admitting: *Deleted

## 2011-04-09 DIAGNOSIS — O209 Hemorrhage in early pregnancy, unspecified: Secondary | ICD-10-CM | POA: Insufficient documentation

## 2011-04-09 DIAGNOSIS — R109 Unspecified abdominal pain: Secondary | ICD-10-CM | POA: Insufficient documentation

## 2011-04-09 LAB — CBC
HCT: 37.2 % (ref 36.0–46.0)
MCH: 30.7 pg (ref 26.0–34.0)
MCHC: 36 g/dL (ref 30.0–36.0)
MCV: 85.1 fL (ref 78.0–100.0)
Platelets: 272 10*3/uL (ref 150–400)
RDW: 12.1 % (ref 11.5–15.5)
WBC: 13.1 10*3/uL — ABNORMAL HIGH (ref 4.0–10.5)

## 2011-04-09 LAB — URINALYSIS, ROUTINE W REFLEX MICROSCOPIC
Bilirubin Urine: NEGATIVE
Ketones, ur: 15 mg/dL — AB
Nitrite: NEGATIVE
Urobilinogen, UA: 0.2 mg/dL (ref 0.0–1.0)

## 2011-04-09 LAB — WET PREP, GENITAL
Trich, Wet Prep: NONE SEEN
Yeast Wet Prep HPF POC: NONE SEEN

## 2011-04-09 LAB — URINE MICROSCOPIC-ADD ON

## 2011-04-09 MED ORDER — METRONIDAZOLE 500 MG PO TABS: 500.0000 mg | ORAL_TABLET | Freq: Two times a day (BID) | ORAL | Status: AC

## 2011-04-09 NOTE — Progress Notes (Signed)
Pt in c/o lower abdominal sharp pains since last week- states it has been off and on since then.  Reports spotting since July 15th- started as a heavy period, but then has gradually subsided.  Took HPT today was "invalid", took one on 07/31 and had a faint positive.

## 2011-04-09 NOTE — Progress Notes (Signed)
PT CAME IN FOR NOB LABS.  PT STATED SHE HAD A POSITIVE PREG TEST AT [facility].  E-CHART RESULTS SHOWED NEG URINE PREG TEST ON 04-01-11. WE DID NOT DRAW PRENATAL LABS BUT SCHEDULED PT FOR FOLLOW UP WITH PCP FOR IRREGULAR BLEEDING SINCE 03-16-11 Taylor [name]

## 2011-04-09 NOTE — ED Provider Notes (Addendum)
History   Pt presents today c/o vag spotting since 03/16/11. She states she was about 10 days late for her menses in July and she has been bleeding ever since. She also reports having a faintly positive home preg test. She denies vag irritation but does report some lower abd cramping. She denies dysuria or fever.  Chief Complaint  Patient presents with  . Back Pain  . Abdominal Pain   HPI  OB History    Grav Para Term Preterm Abortions TAB SAB Ect Mult Living   1 1 1  0 0 0 0 0 0 1      Past Medical History  Diagnosis Date  . Diabetes mellitus     Past Surgical History  Procedure Date  . No past surgeries     No family history on file.  History  Substance Use Topics  . Smoking status: Never Smoker   . Smokeless tobacco: Not on file  . Alcohol Use: No    Allergies: No Known Allergies  Prescriptions prior to admission  Medication Sig Dispense Refill  . glimepiride (AMARYL) 2 MG tablet Take 2 mg by mouth daily.        . metFORMIN (GLUCOPHAGE) 1000 MG tablet Take 1,000 mg by mouth 2 (two) times daily. (dosage change)       . norgestimate-ethinyl estradiol (SPRINTEC 28) 0.25-35 MG-MCG per tablet Take 1 tablet by mouth daily.          Review of Systems  Constitutional: Negative for fever and chills.  Cardiovascular: Negative for chest pain.  Gastrointestinal: Positive for abdominal pain. Negative for nausea, vomiting, diarrhea and constipation.  Genitourinary: Negative for dysuria, urgency, frequency and hematuria.  Neurological: Negative for dizziness and headaches.  Psychiatric/Behavioral: Negative for depression and suicidal ideas.   Physical Exam   Blood pressure 135/89, pulse 96, temperature 98.2 F (36.8 C), temperature source Oral, resp. rate 18, height 5' 0.25" (1.53 m), weight 118 lb 12.8 oz (53.887 kg), last menstrual period 03/17/2011.  Physical Exam  Constitutional: She is oriented to person, place, and time. She appears well-developed and well-nourished.  No distress.  HENT:  Head: Normocephalic and atraumatic.  Eyes: EOM are normal. Pupils are equal, round, and reactive to light.  GI: Soft. She exhibits no distension. There is no tenderness. There is no rebound and no guarding.  Genitourinary: There is bleeding around the vagina. No tenderness around the vagina. Vaginal discharge found.       Minimal amount of vag bleeding noted. Uterus is NL size and shape. No adnexal masses noted. Pt is non-tender to palpation.  Neurological: She is alert and oriented to person, place, and time.  Skin: Skin is warm and dry. She is not diaphoretic.  Psychiatric: She has a normal mood and affect. Her behavior is normal. Judgment and thought content normal.    MAU Course  Procedures  Results for orders placed during the hospital encounter of 04/09/11 (from the past 24 hour(s))  URINALYSIS, ROUTINE W REFLEX MICROSCOPIC     Status: Abnormal   Collection Time   04/09/11  5:25 PM      Component Value Range   Color, Urine YELLOW  YELLOW    Appearance CLEAR  CLEAR    Specific Gravity, Urine 1.015  1.005 - 1.030    pH 6.0  5.0 - 8.0    Glucose, UA >1000 (*) NEGATIVE (mg/dL)   Hgb urine dipstick SMALL (*) NEGATIVE    Bilirubin Urine NEGATIVE  NEGATIVE  Ketones, ur 15 (*) NEGATIVE (mg/dL)   Protein, ur NEGATIVE  NEGATIVE (mg/dL)   Urobilinogen, UA 0.2  0.0 - 1.0 (mg/dL)   Nitrite NEGATIVE  NEGATIVE    Leukocytes, UA NEGATIVE  NEGATIVE   URINE MICROSCOPIC-ADD ON     Status: Abnormal   Collection Time   04/09/11  5:25 PM      Component Value Range   Squamous Epithelial / LPF FEW (*) RARE    WBC, UA 7-10  <3 (WBC/hpf)   Bacteria, UA RARE  RARE   POCT PREGNANCY, URINE     Status: Normal   Collection Time   04/09/11  5:27 PM      Component Value Range   Preg Test, Ur NEGATIVE    CBC     Status: Abnormal   Collection Time   04/09/11  5:48 PM      Component Value Range   WBC 13.1 (*) 4.0 - 10.5 (K/uL)   RBC 4.37  3.87 - 5.11 (MIL/uL)   Hemoglobin 13.4   12.0 - 15.0 (g/dL)   HCT 96.0  45.4 - 09.8 (%)   MCV 85.1  78.0 - 100.0 (fL)   MCH 30.7  26.0 - 34.0 (pg)   MCHC 36.0  30.0 - 36.0 (g/dL)   RDW 11.9  14.7 - 82.9 (%)   Platelets 272  150 - 400 (K/uL)  HCG, SERUM, QUALITATIVE     Status: Abnormal   Collection Time   04/09/11  5:48 PM      Component Value Range   Preg, Serum POSITIVE (*) NEGATIVE   HCG, QUANTITATIVE, PREGNANCY     Status: Abnormal   Collection Time   04/09/11  5:48 PM      Component Value Range   hCG, Beta Chain, Quant, S 88 (*) <5 (mIU/mL)  ABO/RH     Status: Normal   Collection Time   04/09/11  5:48 PM      Component Value Range   ABO/RH(D) A POS    WET PREP, GENITAL     Status: Abnormal   Collection Time   04/09/11  5:55 PM      Component Value Range   Yeast, Wet Prep NONE SEEN  NONE SEEN    Trich, Wet Prep NONE SEEN  NONE SEEN    Clue Cells, Wet Prep MODERATE (*) NONE SEEN    WBC, Wet Prep HPF POC FEW (*) NONE SEEN    US Ob Comp Less 14 Wks  04/09/2011  *RADIOLOGY REPORT*  Clinical Data: Bleeding  OBSTETRIC <14 WK [place] AND TRANSVAGINAL OB US  Technique:  Both transabdominal and transvaginal ultrasound examinations were performed for complete evaluation of the gestation as well as the maternal uterus, adnexal regions, and pelvic cul-de-sac.  Transvaginal technique was performed to assess early pregnancy.  Comparison:  None.  Intrauterine gestational sac:  Not visualized.  Maternal uterus/adnexae: Uterus measures 7.2 x 4.3 x 5.5 cm.  Endometrial complex measures 7.5 mm in thickness.  1.3 x 0.5 cm eccentric echogenic focus associated with the endometrial complex, without associated focal Doppler flow. This appearance is nonspecific although it could reflect very early decidual reaction.  Right ovary is within normal limits, measuring 2.0 x 3.4 x 2.3 cm.  Left ovary is within normal limits, measuring 1.7 x 2.4 x 2.4 cm.  IMPRESSION: No intrauterine gestational sac is seen.  This is not unexpected given the low quantitative beta  HCG.  In the setting of a positive pregnancy test, this reflects  pregnancy of unknown location.  Differential diagnosis includes early normal IUP, abnormal IUP, or nonvisualized ectopic pregnancy. Correlation with serial beta HCG is suggested, supplemented by repeat sonography in 10 days (or earlier as clinically warranted).  Original Report Authenticated By: Charline Bills, M.D.   US Ob Transvaginal  04/09/2011  *RADIOLOGY REPORT*  Clinical Data: Bleeding  OBSTETRIC <14 WK [place] AND TRANSVAGINAL OB US  Technique:  Both transabdominal and transvaginal ultrasound examinations were performed for complete evaluation of the gestation as well as the maternal uterus, adnexal regions, and pelvic cul-de-sac.  Transvaginal technique was performed to assess early pregnancy.  Comparison:  None.  Intrauterine gestational sac:  Not visualized.  Maternal uterus/adnexae: Uterus measures 7.2 x 4.3 x 5.5 cm.  Endometrial complex measures 7.5 mm in thickness.  1.3 x 0.5 cm eccentric echogenic focus associated with the endometrial complex, without associated focal Doppler flow. This appearance is nonspecific although it could reflect very early decidual reaction.  Right ovary is within normal limits, measuring 2.0 x 3.4 x 2.3 cm.  Left ovary is within normal limits, measuring 1.7 x 2.4 x 2.4 cm.  IMPRESSION: No intrauterine gestational sac is seen.  This is not unexpected given the low quantitative beta HCG.  In the setting of a positive pregnancy test, this reflects pregnancy of unknown location.  Differential diagnosis includes early normal IUP, abnormal IUP, or nonvisualized ectopic pregnancy. Correlation with serial beta HCG is suggested, supplemented by repeat sonography in 10 days (or earlier as clinically warranted).  Original Report Authenticated By: Charline Bills, M.D.     Assessment and Plan  Vag bleeding in preg: discussed with pt at length. She will f/u in 2 days for repeat B-quant. Discussed signs and sx of  ectopic preg. She understood and agreed. Discussed diet, activity, risks, and precautions. Will also give Rx for Flagyl. Warned of antabuse reaction.  Clinton Gallant. Rice III, DrHSc, MPAS, PA-C  04/09/2011, 5:54 PM   Henrietta Hoover, PA 04/09/11 1947

## 2011-04-09 NOTE — Progress Notes (Signed)
Patient reports has been bleeding since 03/17/11, having sharp pains in stomach and back pain, came to MAU previously but left because we were too busy.

## 2011-04-11 ENCOUNTER — Inpatient Hospital Stay (HOSPITAL_COMMUNITY)
Admission: AD | Admit: 2011-04-11 | Discharge: 2011-04-11 | Disposition: A | Discharge status: Home / Self Care | Payer: Self-pay | Source: Ambulatory Visit | Attending: Family Medicine | Admitting: Family Medicine

## 2011-04-11 ENCOUNTER — Encounter (HOSPITAL_COMMUNITY): Payer: Self-pay | Admitting: *Deleted

## 2011-04-11 DIAGNOSIS — O200 Threatened abortion: Secondary | ICD-10-CM | POA: Insufficient documentation

## 2011-04-11 HISTORY — DX: Gestational diabetes mellitus in pregnancy, unspecified control: O24.419

## 2011-04-11 NOTE — ED Provider Notes (Addendum)
History     Chief Complaint  Patient presents with  . Follow-up   HPI Was in MAU on 04-09-11 for bleeding and quant was 88.  No IUP was seen on ultrasound.  No adnexal masses.  Returns today for repeat quant. No pain today.  Significant history of Type 2 diabetes which client is "controlling with diet".  Not taking prescribed medications.  No checking blood sugars.  Has an appointment with [facility] on Tuesday. OB History    Grav Para Term Preterm Abortions TAB SAB Ect Mult Living   2 1 1  0 0 0 0 0 0 1      Past Medical History  Diagnosis Date  . Diabetes mellitus   . Gestational diabetes     Past Surgical History  Procedure Date  . No past surgeries     No family history on file.  History  Substance Use Topics  . Smoking status: Never Smoker   . Smokeless tobacco: Not on file  . Alcohol Use: No    Allergies: No Known Allergies  Prescriptions prior to admission  Medication Sig Dispense Refill  . glimepiride (AMARYL) 2 MG tablet Take 2 mg by mouth daily.        . metFORMIN (GLUCOPHAGE) 1000 MG tablet Take 1,000 mg by mouth 2 (two) times daily. (dosage change)       . metroNIDAZOLE (FLAGYL) 500 MG tablet Take 1 tablet (500 mg total) by mouth 2 (two) times daily.  14 tablet  0  . Multiple Vitamins-Minerals (MULTIVITAMIN WITH MINERALS) tablet Take 1 tablet by mouth daily.        . norgestimate-ethinyl estradiol (SPRINTEC 28) 0.25-35 MG-MCG per tablet Take 1 tablet by mouth daily.          ROS Physical Exam   Blood pressure 132/85, pulse 83, temperature 98.4 F (36.9 C), temperature source Oral, resp. rate 20, height 5' (1.524 m), weight 119 lb (53.978 kg), last menstrual period 03/17/2011.  Physical Exam  Nursing note and vitals reviewed. Constitutional: She is oriented to person, place, and time. She appears well-developed and well-nourished.       In no distress  HENT:  Head: Normocephalic.  Eyes: EOM are normal.  Neck: Neck supple.  GI: Soft. There  is no tenderness. There is no rebound and no guarding.  Musculoskeletal: Normal range of motion.  Neurological: She is alert and oriented to person, place, and time.  Skin: Skin is warm and dry.  Psychiatric: She has a normal mood and affect.    MAU Course  Procedures  MDM Results for orders placed during the hospital encounter of 04/11/11 (from the past 24 hour(s))  HCG, QUANTITATIVE, PREGNANCY     Status: Abnormal   Collection Time   04/11/11  7:52 PM      Component Value Range   hCG, Beta Chain, Quant, S 58 (*) <5 (mIU/mL)    Assessment and Plan  Low quant which is decreasing  Plan: Consult with Dr. Shawnie Pons Will return on Monday between 8 am and 12 noon for repeat BHCG to confirm decreasing quant. Client in agreement. Advised to keep appointment [facility] on Tuesday for management of diabetes. Continue ectopic precautions - no intercourse, no heavy lifting. Return sooner with any heavy bleeding or sudden, severe pain.   BURLESON,TERRI 04/11/2011, 9:00 PM   Nolene Bernheim, NP 04/11/11 2107

## 2011-04-11 NOTE — Progress Notes (Signed)
Pt states, " I  was here Wed, and was told to return today for labs. I am not having any bleeding or pain now."

## 2011-04-15 ENCOUNTER — Ambulatory Visit: Payer: Self-pay | Admitting: Family Medicine

## 2011-04-23 ENCOUNTER — Encounter: Payer: Self-pay | Admitting: Family Medicine

## 2011-04-30 ENCOUNTER — Encounter: Payer: Self-pay | Admitting: Family Medicine

## 2011-05-29 LAB — POCT URINALYSIS DIP (DEVICE)
Bilirubin Urine: NEGATIVE
Glucose, UA: NEGATIVE
Glucose, UA: 100 — AB
Hgb urine dipstick: NEGATIVE
Hgb urine dipstick: NEGATIVE
Hgb urine dipstick: NEGATIVE
Hgb urine dipstick: NEGATIVE
Ketones, ur: NEGATIVE
Nitrite: NEGATIVE
Nitrite: NEGATIVE
Protein, ur: NEGATIVE
Protein, ur: 100 — AB
Protein, ur: NEGATIVE
Specific Gravity, Urine: 1.015
Specific Gravity, Urine: 1.025
Specific Gravity, Urine: 1.02
Specific Gravity, Urine: 1.02
Urobilinogen, UA: 2 — ABNORMAL HIGH
Urobilinogen, UA: 1
Urobilinogen, UA: 2 — ABNORMAL HIGH
pH: 6
pH: 6.5
pH: 6.5
pH: 7

## 2011-05-29 LAB — CBC
Hemoglobin: 10.6 — ABNORMAL LOW
MCHC: 34.4
MCV: 85.8
RBC: 3.6 — ABNORMAL LOW
WBC: 17.7 — ABNORMAL HIGH

## 2011-05-29 LAB — COMPREHENSIVE METABOLIC PANEL
ALT: 30
AST: 37
CO2: 20
Chloride: 103
GFR calc Af Amer: >60
GFR calc non Af Amer: >60
Glucose, Bld: 84
Sodium: 131 — ABNORMAL LOW
Total Bilirubin: 0.7

## 2011-05-29 LAB — URINALYSIS, ROUTINE W REFLEX MICROSCOPIC
Hgb urine dipstick: NEGATIVE
Nitrite: NEGATIVE
Specific Gravity, Urine: 1.025
Urobilinogen, UA: 1
pH: 5.5

## 2011-05-29 LAB — MONONUCLEOSIS SCREEN: Mono Screen: NEGATIVE

## 2011-05-30 LAB — POCT URINALYSIS DIP (DEVICE)
Bilirubin Urine: NEGATIVE
Glucose, UA: NEGATIVE
Glucose, UA: NEGATIVE
Glucose, UA: NEGATIVE
Glucose, UA: NEGATIVE
Hgb urine dipstick: NEGATIVE
Hgb urine dipstick: NEGATIVE
Hgb urine dipstick: NEGATIVE
Hgb urine dipstick: NEGATIVE
Specific Gravity, Urine: 1.015
Specific Gravity, Urine: 1.02
Specific Gravity, Urine: 1.015
Specific Gravity, Urine: 1.02
Urobilinogen, UA: 0.2
Urobilinogen, UA: 0.2
pH: 6

## 2011-06-04 LAB — CBC
Hemoglobin: 10.7 — ABNORMAL LOW
MCHC: 33.9
RBC: 3.76 — ABNORMAL LOW

## 2011-06-04 LAB — COMPREHENSIVE METABOLIC PANEL
ALT: 21
Alkaline Phosphatase: 377 — ABNORMAL HIGH
CO2: 21
Calcium: 7.9 — ABNORMAL LOW
Chloride: 108
GFR calc non Af Amer: >60
Glucose, Bld: 112 — ABNORMAL HIGH
Potassium: 3.5
Sodium: 135
Total Bilirubin: 0.6

## 2011-06-04 LAB — GLUCOSE, CAPILLARY: Glucose-Capillary: 89

## 2011-06-04 LAB — RPR: RPR Ser Ql: NONREACTIVE

## 2011-07-03 NOTE — ED Provider Notes (Signed)
Chart reviewed and agree with management and plan.  

## 2011-07-07 NOTE — ED Provider Notes (Signed)
Agree with above note.  Taylor [name] 07/07/2011 11:00 AM    

## 2014-07-03 ENCOUNTER — Encounter (HOSPITAL_COMMUNITY): Payer: Self-pay | Admitting: *Deleted

## 2016-03-04 ENCOUNTER — Inpatient Hospital Stay (HOSPITAL_COMMUNITY)
Admission: EM | Admit: 2016-03-04 | Discharge: 2016-03-06 | DRG: 638 | Disposition: A | Discharge status: Home / Self Care | Payer: BLUE CROSS/BLUE SHIELD | Attending: Internal Medicine | Admitting: Internal Medicine

## 2016-03-04 ENCOUNTER — Emergency Department (HOSPITAL_COMMUNITY): Payer: BLUE CROSS/BLUE SHIELD

## 2016-03-04 ENCOUNTER — Encounter (HOSPITAL_COMMUNITY): Payer: Self-pay

## 2016-03-04 DIAGNOSIS — R609 Edema, unspecified: Secondary | ICD-10-CM | POA: Diagnosis not present

## 2016-03-04 DIAGNOSIS — E872 Acidosis, unspecified: Secondary | ICD-10-CM | POA: Insufficient documentation

## 2016-03-04 DIAGNOSIS — Z86711 Personal history of pulmonary embolism: Secondary | ICD-10-CM

## 2016-03-04 DIAGNOSIS — R0602 Shortness of breath: Secondary | ICD-10-CM | POA: Diagnosis present

## 2016-03-04 DIAGNOSIS — E860 Dehydration: Secondary | ICD-10-CM | POA: Diagnosis present

## 2016-03-04 DIAGNOSIS — N179 Acute kidney failure, unspecified: Secondary | ICD-10-CM | POA: Diagnosis present

## 2016-03-04 DIAGNOSIS — E1110 Type 2 diabetes mellitus with ketoacidosis without coma: Secondary | ICD-10-CM | POA: Insufficient documentation

## 2016-03-04 DIAGNOSIS — E1310 Other specified diabetes mellitus with ketoacidosis without coma: Principal | ICD-10-CM

## 2016-03-04 DIAGNOSIS — E1010 Type 1 diabetes mellitus with ketoacidosis without coma: Secondary | ICD-10-CM

## 2016-03-04 DIAGNOSIS — R358 Other polyuria: Secondary | ICD-10-CM

## 2016-03-04 DIAGNOSIS — R3589 Other polyuria: Secondary | ICD-10-CM

## 2016-03-04 DIAGNOSIS — Z794 Long term (current) use of insulin: Secondary | ICD-10-CM

## 2016-03-04 DIAGNOSIS — R631 Polydipsia: Secondary | ICD-10-CM

## 2016-03-04 DIAGNOSIS — R000 Tachycardia, unspecified: Secondary | ICD-10-CM

## 2016-03-04 DIAGNOSIS — R079 Chest pain, unspecified: Secondary | ICD-10-CM

## 2016-03-04 LAB — CBC
HEMATOCRIT: 48.5 % — AB (ref 36.0–46.0)
HEMATOCRIT: 39.2 % (ref 36.0–46.0)
HEMOGLOBIN: 15.9 g/dL — AB (ref 12.0–15.0)
HEMOGLOBIN: 13 g/dL (ref 12.0–15.0)
MCH: 28.9 pg (ref 26.0–34.0)
MCH: 27.8 pg (ref 26.0–34.0)
MCHC: 32.8 g/dL (ref 30.0–36.0)
MCHC: 33.2 g/dL (ref 30.0–36.0)
MCV: 88 fL (ref 78.0–100.0)
MCV: 83.9 fL (ref 78.0–100.0)
Platelets: 583 10*3/uL — ABNORMAL HIGH (ref 150–400)
Platelets: 421 10*3/uL — ABNORMAL HIGH (ref 150–400)
RBC: 5.51 MIL/uL — ABNORMAL HIGH (ref 3.87–5.11)
RBC: 4.67 MIL/uL (ref 3.87–5.11)
RDW: 13.2 % (ref 11.5–15.5)
RDW: 13 % (ref 11.5–15.5)
WBC: 30.9 10*3/uL — ABNORMAL HIGH (ref 4.0–10.5)
WBC: 30.9 10*3/uL — AB (ref 4.0–10.5)

## 2016-03-04 LAB — HEPATIC FUNCTION PANEL
ALK PHOS: 118 U/L (ref 38–126)
ALT: 15 U/L (ref 14–54)
AST: 19 U/L (ref 15–41)
Albumin: 4.5 g/dL (ref 3.5–5.0)
BILIRUBIN DIRECT: 0.1 mg/dL (ref 0.1–0.5)
BILIRUBIN INDIRECT: 1.2 mg/dL — AB (ref 0.3–0.9)
BILIRUBIN TOTAL: 1.3 mg/dL — AB (ref 0.3–1.2)
Total Protein: 9.5 g/dL — ABNORMAL HIGH (ref 6.5–8.1)

## 2016-03-04 LAB — BASIC METABOLIC PANEL
ANION GAP: 27 — AB (ref 5–15)
ANION GAP: 10 (ref 5–15)
ANION GAP: 8 (ref 5–15)
ANION GAP: 6 (ref 5–15)
BUN: 21 mg/dL — ABNORMAL HIGH (ref 6–20)
BUN: 14 mg/dL (ref 6–20)
BUN: 10 mg/dL (ref 6–20)
BUN: 8 mg/dL (ref 6–20)
CHLORIDE: 97 mmol/L — AB (ref 101–111)
CHLORIDE: 116 mmol/L — AB (ref 101–111)
CHLORIDE: 116 mmol/L — AB (ref 101–111)
CHLORIDE: 115 mmol/L — AB (ref 101–111)
CO2: 7 mmol/L — AB (ref 22–32)
CO2: 7 mmol/L — ABNORMAL LOW (ref 22–32)
CO2: 9 mmol/L — ABNORMAL LOW (ref 22–32)
CO2: 11 mmol/L — ABNORMAL LOW (ref 22–32)
Calcium: 9.6 mg/dL (ref 8.9–10.3)
Calcium: 7.4 mg/dL — ABNORMAL LOW (ref 8.9–10.3)
Calcium: 7.3 mg/dL — ABNORMAL LOW (ref 8.9–10.3)
Calcium: 7.5 mg/dL — ABNORMAL LOW (ref 8.9–10.3)
Creatinine, Ser: 1.4 mg/dL — ABNORMAL HIGH (ref 0.44–1.00)
Creatinine, Ser: 0.88 mg/dL (ref 0.44–1.00)
Creatinine, Ser: 0.69 mg/dL (ref 0.44–1.00)
Creatinine, Ser: 0.53 mg/dL (ref 0.44–1.00)
GFR calc Af Amer: 59 mL/min — ABNORMAL LOW (ref >60)
GFR calc Af Amer: >60 mL/min (ref >60)
GFR calc Af Amer: >60 mL/min (ref >60)
GFR, EST NON AFRICAN AMERICAN: 51 mL/min — AB (ref >60)
GLUCOSE: 615 mg/dL — AB (ref 65–99)
Glucose, Bld: 198 mg/dL — ABNORMAL HIGH (ref 65–99)
Glucose, Bld: 170 mg/dL — ABNORMAL HIGH (ref 65–99)
Glucose, Bld: 117 mg/dL — ABNORMAL HIGH (ref 65–99)
POTASSIUM: 4.8 mmol/L (ref 3.5–5.1)
POTASSIUM: 4 mmol/L (ref 3.5–5.1)
POTASSIUM: 3.9 mmol/L (ref 3.5–5.1)
POTASSIUM: 3.2 mmol/L — AB (ref 3.5–5.1)
SODIUM: 133 mmol/L — AB (ref 135–145)
SODIUM: 133 mmol/L — AB (ref 135–145)
SODIUM: 132 mmol/L — AB (ref 135–145)
Sodium: 131 mmol/L — ABNORMAL LOW (ref 135–145)

## 2016-03-04 LAB — URINE MICROSCOPIC-ADD ON

## 2016-03-04 LAB — URINALYSIS, ROUTINE W REFLEX MICROSCOPIC
BILIRUBIN URINE: NEGATIVE
Leukocytes, UA: NEGATIVE
Nitrite: NEGATIVE
PH: 5 (ref 5.0–8.0)
Protein, ur: 100 mg/dL — AB
SPECIFIC GRAVITY, URINE: 1.033 — AB (ref 1.005–1.030)

## 2016-03-04 LAB — I-STAT VENOUS BLOOD GAS, ED
Acid-base deficit: 29 mmol/L — ABNORMAL HIGH (ref 0.0–2.0)
Bicarbonate: 3.3 mEq/L — ABNORMAL LOW (ref 20.0–24.0)
O2 SAT: 41 %
PCO2 VEN: 18.7 mmHg — AB (ref 45.0–50.0)
TCO2: <5 mmol/L (ref 0–100)
pH, Ven: 6.86 — CL (ref 7.250–7.300)
pO2, Ven: 40 mmHg (ref 31.0–45.0)

## 2016-03-04 LAB — CBG MONITORING, ED
GLUCOSE-CAPILLARY: 456 mg/dL — AB (ref 65–99)
GLUCOSE-CAPILLARY: 356 mg/dL — AB (ref 65–99)
Glucose-Capillary: >600 mg/dL (ref 65–99)
Glucose-Capillary: 524 mg/dL (ref 65–99)
Glucose-Capillary: 287 mg/dL — ABNORMAL HIGH (ref 65–99)
Glucose-Capillary: 228 mg/dL — ABNORMAL HIGH (ref 65–99)
Glucose-Capillary: 194 mg/dL — ABNORMAL HIGH (ref 65–99)

## 2016-03-04 LAB — D-DIMER, QUANTITATIVE (NOT AT ARMC): D DIMER QUANT: 0.7 ug{FEU}/mL — AB (ref 0.00–0.50)

## 2016-03-04 LAB — I-STAT TROPONIN, ED: Troponin i, poc: 0 ng/mL (ref 0.00–0.08)

## 2016-03-04 LAB — I-STAT BETA HCG BLOOD, ED (MC, WL, AP ONLY): I-stat hCG, quantitative: <5 m[IU]/mL (ref <5)

## 2016-03-04 LAB — GLUCOSE, CAPILLARY
GLUCOSE-CAPILLARY: 181 mg/dL — AB (ref 65–99)
GLUCOSE-CAPILLARY: 168 mg/dL — AB (ref 65–99)
GLUCOSE-CAPILLARY: 134 mg/dL — AB (ref 65–99)
GLUCOSE-CAPILLARY: 117 mg/dL — AB (ref 65–99)
GLUCOSE-CAPILLARY: 118 mg/dL — AB (ref 65–99)
GLUCOSE-CAPILLARY: 117 mg/dL — AB (ref 65–99)
Glucose-Capillary: 149 mg/dL — ABNORMAL HIGH (ref 65–99)
Glucose-Capillary: 119 mg/dL — ABNORMAL HIGH (ref 65–99)
Glucose-Capillary: 127 mg/dL — ABNORMAL HIGH (ref 65–99)

## 2016-03-04 LAB — LIPASE, BLOOD: LIPASE: 12 U/L (ref 11–51)

## 2016-03-04 LAB — TROPONIN I

## 2016-03-04 LAB — ETHANOL: Alcohol, Ethyl (B): <5 mg/dL (ref <5)

## 2016-03-04 LAB — MRSA PCR SCREENING: MRSA by PCR: NEGATIVE

## 2016-03-04 LAB — PHOSPHORUS: PHOSPHORUS: 1.1 mg/dL — AB (ref 2.5–4.6)

## 2016-03-04 LAB — I-STAT CG4 LACTIC ACID, ED
LACTIC ACID, VENOUS: 5.18 mmol/L — AB (ref 0.5–1.9)
LACTIC ACID, VENOUS: 1.99 mmol/L — AB (ref 0.5–1.9)

## 2016-03-04 LAB — LACTIC ACID, PLASMA: LACTIC ACID, VENOUS: 1.8 mmol/L (ref 0.5–1.9)

## 2016-03-04 LAB — PROCALCITONIN: Procalcitonin: 0.75 ng/mL

## 2016-03-04 LAB — MAGNESIUM: Magnesium: 1.6 mg/dL — ABNORMAL LOW (ref 1.7–2.4)

## 2016-03-04 MED ORDER — SODIUM CHLORIDE 0.9 % IV BOLUS (SEPSIS)
2000.0000 mL | Freq: Once | INTRAVENOUS | Status: AC
  Administered 2016-03-04: 2000 mL via INTRAVENOUS

## 2016-03-04 MED ORDER — IOPAMIDOL (ISOVUE-370) INJECTION 76%
INTRAVENOUS | Status: AC
  Administered 2016-03-04: 61 mL via INTRAVENOUS
  Filled 2016-03-04: qty 100

## 2016-03-04 MED ORDER — ONDANSETRON 4 MG PO TBDP
4.0000 mg | ORAL_TABLET | Freq: Three times a day (TID) | ORAL | Status: DC
  Filled 2016-03-04 (×2): qty 1

## 2016-03-04 MED ORDER — SODIUM CHLORIDE 0.9 % IV SOLN
INTRAVENOUS | Status: AC
  Administered 2016-03-04: 14:00:00 via INTRAVENOUS

## 2016-03-04 MED ORDER — ONDANSETRON HCL 4 MG/5ML PO SOLN: 4.0000 mg | Freq: Three times a day (TID) | ORAL | Status: DC | PRN

## 2016-03-04 MED ORDER — ONDANSETRON HCL 4 MG/5ML PO SOLN
4.0000 mg | Freq: Three times a day (TID) | ORAL | Status: DC
  Filled 2016-03-04: qty 5

## 2016-03-04 MED ORDER — DEXTROSE-NACL 5-0.45 % IV SOLN: INTRAVENOUS | Status: DC

## 2016-03-04 MED ORDER — PIPERACILLIN-TAZOBACTAM 3.375 G IVPB 30 MIN
3.3750 g | Freq: Once | INTRAVENOUS | Status: AC
  Administered 2016-03-04: 3.375 g via INTRAVENOUS
  Filled 2016-03-04: qty 50

## 2016-03-04 MED ORDER — ENOXAPARIN SODIUM 60 MG/0.6ML ~~LOC~~ SOLN
1.0000 mg/kg | Freq: Two times a day (BID) | SUBCUTANEOUS | Status: DC
  Administered 2016-03-04 – 2016-03-06 (×4): 55 mg via SUBCUTANEOUS
  Filled 2016-03-04: qty 0.55
  Filled 2016-03-04: qty 0.6
  Filled 2016-03-04 (×2): qty 0.55
  Filled 2016-03-04: qty 0.6

## 2016-03-04 MED ORDER — SODIUM CHLORIDE 0.9 % IV SOLN
INTRAVENOUS | Status: DC
  Filled 2016-03-04: qty 2.5

## 2016-03-04 MED ORDER — SODIUM CHLORIDE 0.9 % IV SOLN: INTRAVENOUS | Status: DC

## 2016-03-04 MED ORDER — DEXTROSE-NACL 5-0.45 % IV SOLN
INTRAVENOUS | Status: DC
  Administered 2016-03-04 (×2): via INTRAVENOUS

## 2016-03-04 MED ORDER — PIPERACILLIN-TAZOBACTAM 3.375 G IVPB
3.3750 g | Freq: Three times a day (TID) | INTRAVENOUS | Status: DC
  Administered 2016-03-04 – 2016-03-05 (×2): 3.375 g via INTRAVENOUS
  Filled 2016-03-04 (×5): qty 50

## 2016-03-04 MED ORDER — INSULIN REGULAR BOLUS VIA INFUSION
5.0000 [IU] | Freq: Once | INTRAVENOUS | Status: AC
  Administered 2016-03-04: 5 [IU] via INTRAVENOUS
  Filled 2016-03-04: qty 5

## 2016-03-04 MED ORDER — VANCOMYCIN HCL 500 MG IV SOLR
500.0000 mg | Freq: Two times a day (BID) | INTRAVENOUS | Status: DC
  Administered 2016-03-04: 500 mg via INTRAVENOUS
  Filled 2016-03-04 (×4): qty 500

## 2016-03-04 MED ORDER — SODIUM CHLORIDE 0.9 % IV BOLUS (SEPSIS)
1000.0000 mL | Freq: Once | INTRAVENOUS | Status: AC
  Administered 2016-03-04: 1000 mL via INTRAVENOUS

## 2016-03-04 MED ORDER — LORAZEPAM 2 MG/ML IJ SOLN
0.5000 mg | Freq: Once | INTRAMUSCULAR | Status: AC
  Administered 2016-03-04: 0.5 mg via INTRAVENOUS
  Filled 2016-03-04: qty 1

## 2016-03-04 MED ORDER — POTASSIUM CHLORIDE CRYS ER 20 MEQ PO TBCR
40.0000 meq | EXTENDED_RELEASE_TABLET | Freq: Once | ORAL | Status: AC
  Administered 2016-03-04: 40 meq via ORAL
  Filled 2016-03-04: qty 2

## 2016-03-04 MED ORDER — SODIUM CHLORIDE 0.9 % IV SOLN
INTRAVENOUS | Status: DC
  Administered 2016-03-04: 4.6 [IU]/h via INTRAVENOUS
  Filled 2016-03-04: qty 2.5

## 2016-03-04 MED ORDER — POTASSIUM CHLORIDE IN NACL 20-0.9 MEQ/L-% IV SOLN
Freq: Once | INTRAVENOUS | Status: AC
  Administered 2016-03-04: 10:00:00 via INTRAVENOUS
  Filled 2016-03-04: qty 1000

## 2016-03-04 MED ORDER — VANCOMYCIN HCL IN DEXTROSE 1-5 GM/200ML-% IV SOLN
1000.0000 mg | Freq: Once | INTRAVENOUS | Status: AC
  Administered 2016-03-04: 1000 mg via INTRAVENOUS
  Filled 2016-03-04: qty 200

## 2016-03-04 MED ORDER — KCL IN DEXTROSE-NACL 30-5-0.45 MEQ/L-%-% IV SOLN
INTRAVENOUS | Status: DC
  Administered 2016-03-04 – 2016-03-05 (×2): via INTRAVENOUS
  Filled 2016-03-04 (×4): qty 1000

## 2016-03-04 MED ORDER — ONDANSETRON HCL 4 MG/5ML PO SOLN
4.0000 mg | Freq: Three times a day (TID) | ORAL | Status: DC | PRN
  Filled 2016-03-04: qty 5

## 2016-03-04 MED ORDER — INSULIN GLARGINE 100 UNIT/ML ~~LOC~~ SOLN
15.0000 [IU] | Freq: Once | SUBCUTANEOUS | Status: AC
  Administered 2016-03-04: 15 [IU] via SUBCUTANEOUS
  Filled 2016-03-04 (×2): qty 0.15

## 2016-03-04 MED ORDER — HEPARIN SODIUM (PORCINE) 5000 UNIT/ML IJ SOLN
5000.0000 [IU] | Freq: Three times a day (TID) | INTRAMUSCULAR | Status: DC
  Administered 2016-03-04: 5000 [IU] via SUBCUTANEOUS
  Filled 2016-03-04 (×2): qty 1

## 2016-03-04 NOTE — Progress Notes (Signed)
eLink Physician-Brief Progress Note Patient Name: Taylor [name] DOB: December 22, 1985 MRN: [id number]   Date of Service  03/04/2016  HPI/Events of Note    eICU Interventions       Intervention Category Intermediate Interventions: Electrolyte abnormality - evaluation and management  Billy FischerDavid Jaunita Mikels 03/04/2016, 9:51 PM

## 2016-03-04 NOTE — Progress Notes (Signed)
Inpatient Diabetes Program Recommendations  AACE/ADA: New Consensus Statement on Inpatient Glycemic Control (2015)  Target Ranges:  Prepandial:   less than 140 mg/dL      Peak postprandial:   less than 180 mg/dL (1-2 hours)      Critically ill patients:  140 - 180 mg/dL   Lab Results  Component Value Date   GLUCAP 194* 03/04/2016   HGBA1C 12.9 11/20/2009    Review of Glycemic Control  Diabetes history: DM1 Outpatient Diabetes medications: Lantus 10 units with every meal and 15 units QHS Current orders for Inpatient glycemic control: IV insulin per DKA order set  Inpatient Diabetes Program Recommendations:    Continue with GlucoStabilizer until criteria met for discontinuation of drip.  Will f/u in am. Thank you. Lorenda Peck, RD, LDN, CDE Inpatient Diabetes Coordinator [phone]

## 2016-03-04 NOTE — Progress Notes (Signed)
[facility] notified per DKA protocol of potassium of 3.2. Dr Sung AmabileSimonds made aware and orders placed to replace potassium. Will continue to monitor.

## 2016-03-04 NOTE — ED Notes (Signed)
Pt. Presents with complaint of sob/cp/back pain that woke her up from her sleep. Pt. States she did not take her insulin yesterday due to being out of town. Pt. AxO x4.

## 2016-03-04 NOTE — ED Notes (Signed)
Pt. Placed on bed pan by this RN with assistance of Kyle EMT. Pt. States unable urinate at this time. Pt. Requesting to walk to bathroom, this RN explained risks to pt of ambulation in current state. Pt. States she understands and will wait at this time.

## 2016-03-04 NOTE — ED Provider Notes (Signed)
CSN: [id number]     Arrival date & time 03/04/16  [id number] History   First MD Initiated Contact with Patient 03/04/16 [phone]     Chief Complaint  Patient presents with  . Shortness of Breath     (Consider location/radiation/quality/duration/timing/severity/associated sxs/prior Treatment) HPI Comments: 30yo F w/ IDDM who p/w chest pain, shortness of breath, and back pain. The patient was out of town this weekend and ran out of insulin, last dose of insulin was 2 days ago. Yesterday, her husband reports that she was excessively tired and slept most of the day. Last night she began not feeling well and had several episodes of vomiting. Overnight, she woke up with a sudden onset of chest pain and shortness of breath. The chest pain is left-sided and worse when she lays on her left side. The pain radiates to her back. She reports excessive thirst and polyuria. She denies any dysuria or hematuria. No abdominal pain, cough/cold symptoms, fevers, or recent illness. They did have a 10 hour car ride a few days ago. She denies any leg swelling or pain. No history of cancer, blood clots or OCP use. No family history of blood clots.  Patient is a 30 y.o. female presenting with shortness of breath. The history is provided by the patient.  Shortness of Breath   Past Medical History  Diagnosis Date  . Diabetes mellitus   . Gestational diabetes    Past Surgical History  Procedure Laterality Date  . No past surgeries     No family history on file. Social History  Substance Use Topics  . Smoking status: Never Smoker   . Smokeless tobacco: None  . Alcohol Use: No   OB History    Gravida Para Term Preterm AB TAB SAB Ectopic Multiple Living   2 1 1  0 0 0 0 0 0 1     Review of Systems  Respiratory: Positive for shortness of breath.    10 Systems reviewed and are negative for acute change except as noted in the HPI.    Allergies  Review of patient's allergies indicates no known allergies.  Home  Medications   Prior to Admission medications   Medication Sig Start Date End Date Taking? Authorizing Provider  insulin glargine (LANTUS) 100 UNIT/ML injection Inject 10-15 Units into the skin See admin instructions. Pt uses 10 units with every meal and 15 units at bedtime   Yes Historical Provider, MD   BP 133/89 mmHg  Pulse 135  Temp(Src) 97.5 F (36.4 C) (Oral)  Resp 26  Wt 120 lb (54.432 kg)  SpO2 100%  LMP 02/03/2016 Physical Exam  Constitutional: She is oriented to person, place, and time. She appears well-developed and well-nourished. She appears distressed.  Kussmal breathing, sleepy but arousable  HENT:  Head: Normocephalic and atraumatic.  Very dry mucous membranes  Eyes: Conjunctivae are normal. Pupils are equal, round, and reactive to light.  Neck: Neck supple.  Cardiovascular: Regular rhythm and normal heart sounds.  Tachycardia present.   No murmur heard. Pulmonary/Chest: Breath sounds normal. Tachypnea noted. No respiratory distress.  Abdominal: Soft. Bowel sounds are normal. She exhibits no distension. There is no tenderness.  Musculoskeletal: She exhibits no edema or tenderness.  Neurological: She is alert and oriented to person, place, and time.  Sleepy but arousable, answers questions appropriately  Skin: Skin is warm and dry. No rash noted.  Nursing note and vitals reviewed.   ED Course  .Critical Care Performed by: Laurence SpatesLITTLE, Arisbel Maione MORGAN Authorized by:  Andree Heeg MORGAN Total critical care time: 60 minutes Critical care time was exclusive of separately billable procedures and treating other patients. Critical care was necessary to treat or prevent imminent or life-threatening deterioration of the following conditions: endocrine crisis. Critical care was time spent personally by me on the following activities: development of treatment plan with patient or surrogate, discussions with consultants, evaluation of patient's response to treatment, examination  of patient, obtaining history from patient or surrogate, ordering and performing treatments and interventions, ordering and review of laboratory studies, ordering and review of radiographic studies, re-evaluation of patient's condition and review of old charts.   (including critical care time) Labs Review Labs Reviewed  BASIC METABOLIC PANEL - Abnormal; Notable for the following:    Sodium 131 (*)    Chloride 97 (*)    CO2 7 (*)    Glucose, Bld 615 (*)    BUN 21 (*)    Creatinine, Ser 1.40 (*)    GFR calc non Af Amer 51 (*)    GFR calc Af Amer 59 (*)    Anion gap 27 (*)    All other components within normal limits  CBC - Abnormal; Notable for the following:    WBC 30.9 (*)    RBC 5.51 (*)    Hemoglobin 15.9 (*)    HCT 48.5 (*)    Platelets 583 (*)    All other components within normal limits  URINALYSIS, ROUTINE W REFLEX MICROSCOPIC (NOT AT [facility]) - Abnormal; Notable for the following:    Specific Gravity, Urine 1.033 (*)    Glucose, UA >1000 (*)    Hgb urine dipstick TRACE (*)    Ketones, ur >80 (*)    Protein, ur 100 (*)    All other components within normal limits  HEPATIC FUNCTION PANEL - Abnormal; Notable for the following:    Total Protein 9.5 (*)    Total Bilirubin 1.3 (*)    Indirect Bilirubin 1.2 (*)    All other components within normal limits  URINE MICROSCOPIC-ADD ON - Abnormal; Notable for the following:    Squamous Epithelial / LPF 6-30 (*)    Bacteria, UA RARE (*)    Casts GRANULAR CAST (*)    All other components within normal limits  CBG MONITORING, ED - Abnormal; Notable for the following:    Glucose-Capillary >600 (*)    All other components within normal limits  I-STAT CG4 LACTIC ACID, ED - Abnormal; Notable for the following:    Lactic Acid, Venous 5.18 (*)    All other components within normal limits  CULTURE, BLOOD (ROUTINE X 2)  CULTURE, BLOOD (ROUTINE X 2)  URINE CULTURE  LIPASE, BLOOD  D-DIMER, QUANTITATIVE (NOT AT [facility])  BLOOD GAS, VENOUS   I-STAT TROPOININ, ED  I-STAT BETA HCG BLOOD, ED ([facility], [facility], [facility] ONLY)    Imaging Review Dg Chest Port 1 View  03/04/2016  CLINICAL DATA:  Shortness of breath. Mid back pain. Onset this morning. EXAM: PORTABLE CHEST 1 VIEW COMPARISON:  07/30/2006 FINDINGS: The heart size and mediastinal contours are within normal limits. Both lungs are clear. The visualized skeletal structures are unremarkable. IMPRESSION: No active disease. Electronically Signed   By: Charlett Nose M.D.   On: 03/04/2016 08:15   I have personally reviewed and evaluated these images and lab results as part of my medical decision-making.   EKG Interpretation   Date/Time:  Tuesday March 04 2016 07:39:18 EDT Ventricular Rate:  143 PR Interval:  QRS Duration: 91 QT Interval:  359 QTC Calculation: 554 R Axis:   29 Text Interpretation:  Sinus tachycardia Consider right atrial enlargement  LVH by voltage Prolonged QT interval Baseline wander in lead(s) I III aVR  aVL aVF V6 rate faster than previous EKG diffuse T wave inversions new  from previous EKG Confirmed by Maliyah Willets MD, Keimon Basaldua [id number]) on 03/04/2016  8:06:42 AM     Medications  insulin glargine (LANTUS) injection 15 Units (not administered)  piperacillin-tazobactam (ZOSYN) IVPB 3.375 g (3.375 g Intravenous New Bag/Given 03/04/16 0847)  vancomycin (VANCOCIN) IVPB 1000 mg/200 mL premix (1,000 mg Intravenous New Bag/Given 03/04/16 0847)  insulin regular (NOVOLIN R,HUMULIN R) 250 Units in sodium chloride 0.9 % 250 mL (1 Units/mL) infusion (not administered)  insulin regular bolus via infusion 5 Units (not administered)  sodium chloride 0.9 % bolus 1,000 mL (not administered)  0.9 % NaCl with KCl 20 mEq/ L  infusion (not administered)  sodium chloride 0.9 % bolus 2,000 mL (0 mLs Intravenous Stopped 03/04/16 0849)  LORazepam (ATIVAN) injection 0.5 mg (0.5 mg Intravenous Given 03/04/16 0852)    MDM   Final diagnoses:  Diabetic ketoacidosis without coma associated with other  specified diabetes mellitus (HCC)  AKI (acute kidney injury) (HCC)  Chest pain, unspecified chest pain type  Tachycardia   Patient with history of IDDM, off of insulin for the past 2 days, presents with sudden onset of chest pain radiating to her back associated with shortness of breath that began this morning. No fevers or recent infectious symptoms. She has not checked her blood sugar recently because she needs a new meter. On arrival, she was sleepy but arousable, Kussmal breathing. CBG "high". Very dry mucous membranes. Tachycardia and tachypnea on exam but clear BS b/l. VS notable for HR 140s, BP 142/102, RR 31, O2 100% on RA. Exam and hx concerning for DKA. EKG with sinus tachycardia and diffuse T wave inversions. Placed 2 IVs and began 2L IVF bolus. Gave 15u lantus, which is home dose. Obtained above labs which were notable for lactate 5.18, WBC 30,000. Although she appears to have hemoconcentration as all of her cell lines of CBC are elevated, her leukocytosis is concerning for sepsis. Initiated a code sepsis with blood and urine cx, vancomycin and zosyn. VBG concerning with pH 6.799, CO2 26.8. Ordered 5u insulin bolus and insulin drip. Contacted critical care and discussed with Dr. Christene Slatese Dios. He recommended continuing hydration, holding on bicarb for now, and initiating insulin drip with recheck of VBG in a few hrs after insulin initiated.  D-dimer sent for CP and SOB is positive. CTA chest ordered and is pending.  Repeat VBG several hours later is slightly improved at 6.86/18.7. Repeat lactate improved at 1.99. On multiple reexaminations, the patient remains tachypnea contacts tachycardic but her pressure has been normal and her mentation has been stable. Discussed with Dr. Christene Slatese Dios again and pt will be admitted to ICU given profound acidosis in setting of DKA.    Laurence Spatesachel Morgan Jacoya Bauman, MD 03/04/16 [phone]

## 2016-03-04 NOTE — H&P (Addendum)
PULMONARY / CRITICAL CARE MEDICINE   Name: Taylor [name] MRN: [id number] DOB: 27-Dec-1985    ADMISSION DATE:  03/04/2016 CONSULTATION DATE:  03/04/16   REFERRING MD:  EDP , Dr. Clarene DukeLittle MD   CHIEF COMPLAINT:  DKA   HISTORY OF PRESENT ILLNESS:   30yo F w/ IDDM who p/w chest pain, shortness of breath, and back pain. The patient was out of town this weekend and ran out of insulin, last dose of insulin was 2 days ago. Yesterday, her husband reports that she was excessively tired and slept most of the day. Last night she began not feeling well and had several episodes of vomiting. Overnight, she woke up with a sudden onset of chest pain and shortness of breath. The chest pain is left-sided and worse when she lays on her left side. The pain radiates to her back. She reports excessive thirst and polyuria. She denies any dysuria or hematuria. No abdominal pain, cough/cold symptoms, fevers, or recent illness. They did have a 10 hour car ride a few days ago. She presented to ER with BS 615. PH at 6.860, HCO3 3. She was started on Insulin drip . PCCM called for admit to ICU for DKA .   She denies any leg swelling or pain. No history of cancer, blood clots or OCP use. No family history of blood clots. CXR was clear . LA tr down from 5 >1.9 . D. Dimer mild elevated at 0.7 . BS are trending down. Last 2 BS are 228 to 287.   PAST MEDICAL HISTORY :  She  has a past medical history of Diabetes mellitus and Gestational diabetes.  PAST SURGICAL HISTORY: She  has past surgical history that includes No past surgeries.  No Known Allergies  No current facility-administered medications on file prior to encounter.   No current outpatient prescriptions on file prior to encounter.    FAMILY HISTORY:  Her has no family status information on file.   SOCIAL HISTORY: She  reports that she has never smoked. She does not have any smokeless tobacco history on file. She reports that she does not drink alcohol or use illicit  drugs.  REVIEW OF SYSTEMS:   Tried to obtain but pt too sleepy to answer.   SUBJECTIVE:  DKA , see HPI   VITAL SIGNS: BP 128/96 mmHg  Pulse 144  Temp(Src) 97.5 F (36.4 C) (Oral)  Resp 19  Wt 120 lb (54.432 kg)  SpO2 100%  LMP 02/03/2016  HEMODYNAMICS:    VENTILATOR SETTINGS:    INTAKE / OUTPUT:    PHYSICAL EXAMINATION: General:  Sleepy in bed , nad  Neuro:  Alert , arouses to voice  HEENT:  Dry mucosa  Cardiovascular:  ST , no m/r/g.  Lungs:  CTA  Abdomen:  Soft and NT  Musculoskeletal:  Intact  Skin:  Clear w/ no rashes   LABS:  BMET  Recent Labs Lab 03/04/16 0741  NA 131*  K 4.8  CL 97*  CO2 7*  BUN 21*  CREATININE 1.40*  GLUCOSE 615*    Electrolytes  Recent Labs Lab 03/04/16 0741  CALCIUM 9.6    CBC  Recent Labs Lab 03/04/16 0741  WBC 30.9*  HGB 15.9*  HCT 48.5*  PLT 583*    Coag's No results for input(s): APTT, INR in the last 168 hours.  Sepsis Markers  Recent Labs Lab 03/04/16 0759 03/04/16 1134  LATICACIDVEN 5.18* 1.99*    ABG No results for input(s): PHART, PCO2ART, PO2ART in  the last 168 hours.  Liver Enzymes  Recent Labs Lab 03/04/16 0741  AST 19  ALT 15  ALKPHOS 118  BILITOT 1.3*  ALBUMIN 4.5    Cardiac Enzymes No results for input(s): TROPONINI, PROBNP in the last 168 hours.  Glucose  Recent Labs Lab 03/04/16 0737 03/04/16 0857 03/04/16 0953 03/04/16 1054  GLUCAP >600* 524* 456* 356*    Imaging Dg Chest Port 1 View  03/04/2016  CLINICAL DATA:  Shortness of breath. Mid back pain. Onset this morning. EXAM: PORTABLE CHEST 1 VIEW COMPARISON:  07/30/2006 FINDINGS: The heart size and mediastinal contours are within normal limits. Both lungs are clear. The visualized skeletal structures are unremarkable. IMPRESSION: No active disease. Electronically Signed   By: Charlett Nose M.D.   On: 03/04/2016 08:15     STUDIES:    CULTURES: 7/4 BC >> 7/4 UC >>  ANTIBIOTICS: 7/4 Vanc >> 7/4 Zosyn  >>  SIGNIFICANT EVENTS: 7/4 admitted with DKA   LINES/TUBES:   DISCUSSION: 30 yo female with IDDM ran out of insulin x 2 day, presented acutely ill with DKA .   ASSESSMENT / PLAN:  PULMONARY A: CXR clear 7/4  P:   Wean O2 for sat >90-92%  CARDIOVASCULAR A:  ST  P:  Monitor   RENAL A:   Acute renal failure -nml scr in 2011  Metabolic lactic acidosis  P:   IVF resusciatation  Strict I/O  Replaced electrolytes as indicated.  Tr LA   GASTROINTESTINAL A:   N/V  P:   zofran As needed    HEMATOLOGIC A:   Elevated D . Dimer /long car ride  P:  Tr cbc  Check ven dopplers  Consider CTA Chest to r/o PE -may have to do VQ as renal fxn is lower -repeat bmet at 1400   INFECTIOUS A:   ?Sepsis -source unknown , cxr clear , urine cx pending  P:   Follow cx data  Deescalate when able  Check PCT  Tr lactate   ENDOCRINE A:   IDDM  P:   INsulin drip  Check Preg test   NEUROLOGIC A:   AMS secondary to DKA  P:   Limit sedating rx  Check tox screen   FAMILY  - Updates: no family at bedside 7/4   - Inter-disciplinary family meet or Palliative Care meeting due by:  7/11    Tammy Parrett NP-C  Pulmonary and Critical Care Medicine [facility] Pager: [phone]  03/04/2016, 12:43 PM   ATTENDING NOTE / ATTESTATION NOTE :   I have discussed the case with the resident/APP  Tammy Parrett.   I agree with the resident/APP's  history, physical examination, assessment, and plans.    I have edited the above note and modified it according to our agreed history, physical examination, assessment and plan.   Briefly, patient known to be a diabetic for 8 years now, has been on insulin for 4 years. Her family drove to [place] Thursday night straight. They drove back Monday night straight. She forgot her insulin pen while in [place] so she did not take any insulin while up there. Admitted as a DKA. Patient denies any other symptoms other than dyspnea is  morning. This is a little better. No other comorbidities.  Physical exam unremarkable except for persistent tachycardia in the 130s. She seems to be adequately hydrated by the time I have examined her. No neck vein distention. Clear breath sounds. Tachycardic. No edema.  Continue treatment of DKA. Currently  she still on the insulin drip. Electrolytes every 4 hours per protocol. Continue insulin drip until the anion gap is closed.  I cannot completely rule out infection. Chest x-ray is normal. Currently on vancomycin and Zosyn. We will check pro calcitonin. Panculture. We'll quickly de-escalate once we have more data.  I'm concerned about DVT or pulmonary embolism in this patient given recent travel. We cannot get CTA secondary to her creatinine being high. We ordered Dopplers of her leg but I don't think it can be done today. Getting 2-D echo on this patient will be suboptimal because of the sinus tachycardia. I discussed this at length with the patient regarding PE/DVT Dx.  No recent blood clot or DVT. No history of cancer. No history of bleeding. We will empirically treat with Lovenox, 1 mg/kg twice a day. Plan on getting Dopplers or CTA or echo in the morning. I think the benefit of treating her right now outweighs the risks as untreated pulmonary embolism can be fatal.  I have spent 35  minutes of critical care time with this patient today.  Family :  No family at bedside.    Pollie MeyerJ. Angelo A de Dios, MD 03/04/2016, 5:25 PM [place] [facility] Pager [phone] After 3 pm or if no answer, call [phone]

## 2016-03-04 NOTE — Progress Notes (Signed)
Pharmacy Antibiotic Note  Taylor [name] is a 30 y.o. female with hx IDDM who presented to [facility] on  03/04/2016 with CP, SOB, and back pain. Initial work-up revealed elevated CBGs>600, along with leukocytosis and LA 5.18 - pharmacy has been consulted to start Vancomycin + Zosyn for r/o sepsis. SCr 1.4, CrCl~50 ml/min.   Code sepsis was called at 0830. Vanc 1g IV x 1 and Zosyn 3.375g IV x 1 have already been ordered to be given by the ED. These were pulled from the pyxis and delivered to the RN at [phone] with instructions to hang them at the same time.   Plan: 1. Vanc 1g IV x 1 dose to load followed by 500 mg IV every 12 hours 2. Zosyn 3.375g IV x 1 dose over 30 minutes to load followed by 3.375g IV every 8 hours (infused over 4 hours) 3. Will continue to follow renal function, culture results, LOT, and antibiotic de-escalation plans   Weight: 120 lb (54.432 kg)  Temp (24hrs), Avg:97.5 F (36.4 C), Min:97.5 F (36.4 C), Max:97.5 F (36.4 C)   Recent Labs Lab 03/04/16 0741 03/04/16 0759  WBC 30.9*  --   LATICACIDVEN  --  5.18*    CrCl cannot be calculated (Unknown ideal weight.).    No Known Allergies  Antimicrobials this admission: Vanc 7/4 >> Zosyn 7/4 >>  Dose adjustments this admission: n/a  Microbiology results: 7/4 BCx >> 7/4 UCx >>  Thank you for allowing pharmacy to be a part of this patient's care.  Georgina PillionElizabeth Essex Perry, PharmD, BCPS Clinical Pharmacist Pager: [phone] 03/04/2016 10:37 AM

## 2016-03-04 NOTE — ED Notes (Signed)
Attempted report x1. 

## 2016-03-04 NOTE — ED Notes (Signed)
Pt. Stating need to have bowel movement. Pt. Assisted onto bedpan with this RN

## 2016-03-05 ENCOUNTER — Inpatient Hospital Stay (HOSPITAL_COMMUNITY): Payer: BLUE CROSS/BLUE SHIELD

## 2016-03-05 DIAGNOSIS — R609 Edema, unspecified: Secondary | ICD-10-CM

## 2016-03-05 DIAGNOSIS — E1010 Type 1 diabetes mellitus with ketoacidosis without coma: Secondary | ICD-10-CM

## 2016-03-05 DIAGNOSIS — E872 Acidosis, unspecified: Secondary | ICD-10-CM | POA: Insufficient documentation

## 2016-03-05 LAB — BASIC METABOLIC PANEL
ANION GAP: 4 — AB (ref 5–15)
ANION GAP: 6 (ref 5–15)
Anion gap: 9 (ref 5–15)
BUN: 6 mg/dL (ref 6–20)
BUN: <5 mg/dL — ABNORMAL LOW (ref 6–20)
CALCIUM: 7.5 mg/dL — AB (ref 8.9–10.3)
CHLORIDE: 113 mmol/L — AB (ref 101–111)
CHLORIDE: 113 mmol/L — AB (ref 101–111)
CO2: 11 mmol/L — ABNORMAL LOW (ref 22–32)
CO2: 15 mmol/L — AB (ref 22–32)
CO2: 15 mmol/L — AB (ref 22–32)
CREATININE: 0.58 mg/dL (ref 0.44–1.00)
Calcium: 7.6 mg/dL — ABNORMAL LOW (ref 8.9–10.3)
Calcium: 7.9 mg/dL — ABNORMAL LOW (ref 8.9–10.3)
Chloride: 112 mmol/L — ABNORMAL HIGH (ref 101–111)
Creatinine, Ser: 0.51 mg/dL (ref 0.44–1.00)
Creatinine, Ser: 0.47 mg/dL (ref 0.44–1.00)
GFR calc Af Amer: >60 mL/min (ref >60)
GFR calc Af Amer: >60 mL/min (ref >60)
GFR calc non Af Amer: >60 mL/min (ref >60)
GLUCOSE: 187 mg/dL — AB (ref 65–99)
Glucose, Bld: 169 mg/dL — ABNORMAL HIGH (ref 65–99)
Glucose, Bld: 159 mg/dL — ABNORMAL HIGH (ref 65–99)
POTASSIUM: 3.6 mmol/L (ref 3.5–5.1)
POTASSIUM: 3.2 mmol/L — AB (ref 3.5–5.1)
POTASSIUM: 3.5 mmol/L (ref 3.5–5.1)
SODIUM: 132 mmol/L — AB (ref 135–145)
Sodium: 132 mmol/L — ABNORMAL LOW (ref 135–145)
Sodium: 134 mmol/L — ABNORMAL LOW (ref 135–145)

## 2016-03-05 LAB — BLOOD CULTURE ID PANEL (REFLEXED)
Acinetobacter baumannii: NOT DETECTED
CANDIDA GLABRATA: NOT DETECTED
CANDIDA KRUSEI: NOT DETECTED
CANDIDA PARAPSILOSIS: NOT DETECTED
CARBAPENEM RESISTANCE: NOT DETECTED
Candida albicans: NOT DETECTED
Candida tropicalis: NOT DETECTED
ESCHERICHIA COLI: NOT DETECTED
Enterobacter cloacae complex: NOT DETECTED
Enterobacteriaceae species: NOT DETECTED
Enterococcus species: NOT DETECTED
Haemophilus influenzae: NOT DETECTED
KLEBSIELLA OXYTOCA: NOT DETECTED
KLEBSIELLA PNEUMONIAE: NOT DETECTED
LISTERIA MONOCYTOGENES: NOT DETECTED
Methicillin resistance: NOT DETECTED
NEISSERIA MENINGITIDIS: NOT DETECTED
PROTEUS SPECIES: NOT DETECTED
Pseudomonas aeruginosa: NOT DETECTED
SERRATIA MARCESCENS: NOT DETECTED
STAPHYLOCOCCUS SPECIES: NOT DETECTED
STREPTOCOCCUS AGALACTIAE: NOT DETECTED
STREPTOCOCCUS SPECIES: NOT DETECTED
Staphylococcus aureus (BCID): NOT DETECTED
Streptococcus pneumoniae: NOT DETECTED
Streptococcus pyogenes: NOT DETECTED
Vancomycin resistance: NOT DETECTED

## 2016-03-05 LAB — GLUCOSE, CAPILLARY
GLUCOSE-CAPILLARY: 157 mg/dL — AB (ref 65–99)
GLUCOSE-CAPILLARY: 190 mg/dL — AB (ref 65–99)
GLUCOSE-CAPILLARY: 201 mg/dL — AB (ref 65–99)
GLUCOSE-CAPILLARY: 165 mg/dL — AB (ref 65–99)
GLUCOSE-CAPILLARY: 156 mg/dL — AB (ref 65–99)
GLUCOSE-CAPILLARY: 209 mg/dL — AB (ref 65–99)
GLUCOSE-CAPILLARY: 158 mg/dL — AB (ref 65–99)
Glucose-Capillary: 115 mg/dL — ABNORMAL HIGH (ref 65–99)
Glucose-Capillary: 148 mg/dL — ABNORMAL HIGH (ref 65–99)
Glucose-Capillary: 167 mg/dL — ABNORMAL HIGH (ref 65–99)
Glucose-Capillary: 178 mg/dL — ABNORMAL HIGH (ref 65–99)
Glucose-Capillary: 208 mg/dL — ABNORMAL HIGH (ref 65–99)
Glucose-Capillary: 267 mg/dL — ABNORMAL HIGH (ref 65–99)

## 2016-03-05 LAB — RAPID URINE DRUG SCREEN, HOSP PERFORMED
Amphetamines: NOT DETECTED
Barbiturates: NOT DETECTED
Benzodiazepines: NOT DETECTED
Cocaine: NOT DETECTED
OPIATES: NOT DETECTED
Tetrahydrocannabinol: POSITIVE — AB

## 2016-03-05 LAB — POCT I-STAT 3, ART BLOOD GAS (G3+)
ACID-BASE DEFICIT: 30 mmol/L — AB (ref 0.0–2.0)
ACID-BASE DEFICIT: 11 mmol/L — AB (ref 0.0–2.0)
BICARBONATE: 4.2 meq/L — AB (ref 20.0–24.0)
BICARBONATE: 13.9 meq/L — AB (ref 20.0–24.0)
O2 SAT: 69 %
O2 Saturation: 97 %
PH ART: 7.32 — AB (ref 7.350–7.450)
PO2 ART: 66 mmHg — AB (ref 80.0–100.0)
PO2 ART: 97 mmHg (ref 80.0–100.0)
TCO2: 15 mmol/L (ref 0–100)
pCO2 arterial: 26.8 mmHg — ABNORMAL LOW (ref 35.0–45.0)
pCO2 arterial: 26.8 mmHg — ABNORMAL LOW (ref 35.0–45.0)
pH, Arterial: 6.799 — CL (ref 7.350–7.450)

## 2016-03-05 LAB — URINE CULTURE

## 2016-03-05 LAB — HEMOGLOBIN A1C
Hgb A1c MFr Bld: 14.3 % — ABNORMAL HIGH (ref 4.8–5.6)
MEAN PLASMA GLUCOSE: 364 mg/dL

## 2016-03-05 LAB — LACTIC ACID, PLASMA: Lactic Acid, Venous: 0.7 mmol/L (ref 0.5–1.9)

## 2016-03-05 LAB — CBC
HEMATOCRIT: 34.2 % — AB (ref 36.0–46.0)
Hemoglobin: 11.8 g/dL — ABNORMAL LOW (ref 12.0–15.0)
MCH: 28.1 pg (ref 26.0–34.0)
MCHC: 34.5 g/dL (ref 30.0–36.0)
MCV: 81.4 fL (ref 78.0–100.0)
PLATELETS: 312 10*3/uL (ref 150–400)
RBC: 4.2 MIL/uL (ref 3.87–5.11)
RDW: 13.4 % (ref 11.5–15.5)
WBC: 13.7 10*3/uL — AB (ref 4.0–10.5)

## 2016-03-05 LAB — PROCALCITONIN: PROCALCITONIN: 0.93 ng/mL

## 2016-03-05 MED ORDER — INSULIN ASPART 100 UNIT/ML ~~LOC~~ SOLN
0.0000 [IU] | Freq: Three times a day (TID) | SUBCUTANEOUS | Status: DC
  Administered 2016-03-05 – 2016-03-06 (×2): 8 [IU] via SUBCUTANEOUS

## 2016-03-05 MED ORDER — SODIUM CHLORIDE 0.9 % IV SOLN
INTRAVENOUS | Status: DC
  Administered 2016-03-05 (×2): via INTRAVENOUS
  Filled 2016-03-05 (×5): qty 1000

## 2016-03-05 MED ORDER — POTASSIUM CHLORIDE CRYS ER 20 MEQ PO TBCR
40.0000 meq | EXTENDED_RELEASE_TABLET | Freq: Once | ORAL | Status: AC
  Administered 2016-03-05: 40 meq via ORAL
  Filled 2016-03-05: qty 2

## 2016-03-05 MED ORDER — INSULIN ASPART 100 UNIT/ML ~~LOC~~ SOLN
0.0000 [IU] | Freq: Every day | SUBCUTANEOUS | Status: DC
  Administered 2016-03-05: 2 [IU] via SUBCUTANEOUS

## 2016-03-05 MED ORDER — INSULIN GLARGINE 100 UNIT/ML ~~LOC~~ SOLN
15.0000 [IU] | Freq: Every day | SUBCUTANEOUS | Status: DC
Start: 2016-03-05 — End: 2016-03-06
  Administered 2016-03-05: 15 [IU] via SUBCUTANEOUS
  Filled 2016-03-05 (×2): qty 0.15

## 2016-03-05 NOTE — Progress Notes (Signed)
Inpatient Diabetes Program Recommendations  AACE/ADA: New Consensus Statement on Inpatient Glycemic Control (2015)  Target Ranges:  Prepandial:   less than 140 mg/dL      Peak postprandial:   less than 180 mg/dL (1-2 hours)      Critically ill patients:  140 - 180 mg/dL   Lab Results  Component Value Date   GLUCAP 115* 03/05/2016   HGBA1C 14.3* 03/04/2016   Results for [name], Taylor (MRN [id number]) as of 03/05/2016 16:40  Ref. Range 03/05/2016 06:23 03/05/2016 08:04 03/05/2016 09:16 03/05/2016 10:27 03/05/2016 11:48  Glucose-Capillary Latest Ref Range: 65-99 mg/dL 962178 (H) 952156 (H) 841209 (H) 158 (H) 115 (H)   Review of Glycemic Control  Diabetes history: Dm2 Outpatient Diabetes medications: Lantus 15 QHS, Novolog 10 units tidwc Current orders for Inpatient glycemic control: Lantus 15 units QHS, Novolog moderate tidwc and hs  Spoke with pt at length regarding her DKA and being without insulin. Pt states she thought she had enough insulin in the insulin pens, but found out she did not.  States she has appt with endo in the next couple of weeks. Discussed HgbA1C 14.3% and goal of 7%.  Inpatient Diabetes Program Recommendations:    Add Novolog 5 units tidwc for meal coverage insulin.  Will follow. Thank you. Ailene Ardshonda Kalee Broxton, RD, LDN, CDE Inpatient Diabetes Coordinator [phone]

## 2016-03-05 NOTE — Progress Notes (Signed)
VASCULAR LAB PRELIMINARY  PRELIMINARY  PRELIMINARY  PRELIMINARY  Bilateral lower extremity venous duplex completed.    Preliminary report:  Bilateral:  No evidence of DVT, superficial thrombosis, or Baker's Cyst.   Hayley Horn, RVS 03/05/2016, 4:00 PM

## 2016-03-05 NOTE — Progress Notes (Signed)
PHARMACY - PHYSICIAN COMMUNICATION CRITICAL VALUE ALERT - BLOOD CULTURE IDENTIFICATION (BCID)  Results for orders placed or performed during the hospital encounter of 03/04/16  Blood Culture ID Panel (Reflexed) (Collected: 03/04/2016  8:33 AM)  Result Value Ref Range   Enterococcus species NOT DETECTED NOT DETECTED   Vancomycin resistance NOT DETECTED NOT DETECTED   Listeria monocytogenes NOT DETECTED NOT DETECTED   Staphylococcus species NOT DETECTED NOT DETECTED   Staphylococcus aureus NOT DETECTED NOT DETECTED   Methicillin resistance NOT DETECTED NOT DETECTED   Streptococcus species NOT DETECTED NOT DETECTED   Streptococcus agalactiae NOT DETECTED NOT DETECTED   Streptococcus pneumoniae NOT DETECTED NOT DETECTED   Streptococcus pyogenes NOT DETECTED NOT DETECTED   Acinetobacter baumannii NOT DETECTED NOT DETECTED   Enterobacteriaceae species NOT DETECTED NOT DETECTED   Enterobacter cloacae complex NOT DETECTED NOT DETECTED   Escherichia coli NOT DETECTED NOT DETECTED   Klebsiella oxytoca NOT DETECTED NOT DETECTED   Klebsiella pneumoniae NOT DETECTED NOT DETECTED   Proteus species NOT DETECTED NOT DETECTED   Serratia marcescens NOT DETECTED NOT DETECTED   Carbapenem resistance NOT DETECTED NOT DETECTED   Haemophilus influenzae NOT DETECTED NOT DETECTED   Neisseria meningitidis NOT DETECTED NOT DETECTED   Pseudomonas aeruginosa NOT DETECTED NOT DETECTED   Candida albicans NOT DETECTED NOT DETECTED   Candida glabrata NOT DETECTED NOT DETECTED   Candida krusei NOT DETECTED NOT DETECTED   Candida parapsilosis NOT DETECTED NOT DETECTED   Candida tropicalis NOT DETECTED NOT DETECTED   GPR likely diptheroids - contaminant   Name of physician (or Provider) Contacted: Dr Nancy MarusMayo  Changes to prescribed antibiotics required: None  Isaac BlissMichael Marycarmen Hagey, PharmD, BCPS, [facility] Hospital[facility]CCCP Clinical Pharmacist Pager [phone] 03/05/2016 1:50 PM

## 2016-03-05 NOTE — Progress Notes (Signed)
PULMONARY / CRITICAL CARE MEDICINE   Name: Taylor [name] MRN: [id number] DOB: 06-09-1986    ADMISSION DATE:  03/04/2016 CONSULTATION DATE:  03/04/16  REFERRING MD:  EDP, Dr. Clarene DukeLittle  CHIEF COMPLAINT:  DKA  HISTORY OF PRESENT ILLNESS:   30yo F w/ IDDM who p/w chest pain, shortness of breath, and back pain. The patient was out of town this weekend and ran out of insulin, last dose of insulin was 2 days ago. Yesterday, her husband reports that she was excessively tired and slept most of the day. Last night she began not feeling well and had several episodes of vomiting. Overnight, she woke up with a sudden onset of chest pain and shortness of breath. The chest pain is left-sided and worse when she lays on her left side. The pain radiates to her back. She reports excessive thirst and polyuria. She denies any dysuria or hematuria. No abdominal pain, cough/cold symptoms, fevers, or recent illness. They did have a 10 hour car ride a few days ago. She presented to ER with BS 615. PH at 6.860, HCO3 3. She was started on Insulin drip . PCCM called for admit to ICU for DKA .  She denies any leg swelling or pain. No history of cancer, blood clots or OCP use. No family history of blood clots. CXR was clear . LA tr down from 5 >1.9 . D. Dimer mild elevated at 0.7 . BS are trending down. Last 2 BS are 228 to 287.    SUBJECTIVE:  Pt did well overnight. She remained on insulin gtt. No nausea or vomiting. She is hungry this morning and would like to eat. No other subjective complaints.  VITAL SIGNS: BP 126/84 mmHg  Pulse 116  Temp(Src) 97.7 F (36.5 C) (Oral)  Resp 20  Wt 120 lb (54.432 kg)  SpO2 100%  LMP 02/03/2016  HEMODYNAMICS:    VENTILATOR SETTINGS:    INTAKE / OUTPUT: I/O last 3 completed shifts: In: 1824 [I.V.:1824] Out: 300 [Urine:300]  PHYSICAL EXAMINATION: General: Well-appearing, awake, alert Neuro: Awake, alert, answering questions appropriately HEENT: Mildly dry mucous  membranes Cardiovascular: Tachycardic, regular rhythm, no m/r/g.  Lungs: CTAB, normal work of breathing Abdomen: Soft and NT  Musculoskeletal: Intact  Skin: Clear w/ no rashes   LABS:  BMET  Recent Labs Lab 03/04/16 2051 03/05/16 0100 03/05/16 0449  NA 132* 132* 132*  K 3.2* 3.6 3.2*  CL 115* 112* 113*  CO2 11* 11* 15*  BUN 8 6 <5*  CREATININE 0.53 0.58 0.51  GLUCOSE 117* 169* 159*    Electrolytes  Recent Labs Lab 03/04/16 1351  03/04/16 2051 03/05/16 0100 03/05/16 0449  CALCIUM 7.4*  < > 7.5* 7.5* 7.6*  MG 1.6*  --   --   --   --   PHOS 1.1*  --   --   --   --   < > = values in this interval not displayed.  CBC  Recent Labs Lab 03/04/16 0741 03/04/16 1351  WBC 30.9* 30.9*  HGB 15.9* 13.0  HCT 48.5* 39.2  PLT 583* 421*    Coag's No results for input(s): APTT, INR in the last 168 hours.  Sepsis Markers  Recent Labs Lab 03/04/16 1134 03/04/16 1351 03/05/16 0445 03/05/16 0449  LATICACIDVEN 1.99* 1.8 0.7  --   PROCALCITON  --  0.75  --  0.93    ABG No results for input(s): PHART, PCO2ART, PO2ART in the last 168 hours.  Liver Enzymes  Recent Labs Lab  03/04/16 0741  AST 19  ALT 15  ALKPHOS 118  BILITOT 1.3*  ALBUMIN 4.5    Cardiac Enzymes  Recent Labs Lab 03/04/16 1351  TROPONINI <0.03    Glucose  Recent Labs Lab 03/04/16 2004 03/04/16 2102 03/04/16 2206 03/04/16 2302 03/05/16 0018 03/05/16 0124  GLUCAP 119* 118* 117* 127* 148* 157*    Imaging Ct Angio Chest Pe W/cm &/or Wo Cm  03/04/2016  CLINICAL DATA:  Left-sided chest pain, shortness of breath. EXAM: CT ANGIOGRAPHY CHEST WITH CONTRAST TECHNIQUE: Multidetector CT imaging of the chest was performed using the standard protocol during bolus administration of intravenous contrast. Multiplanar CT image reconstructions and MIPs were obtained to evaluate the vascular anatomy. CONTRAST:  61 cc Isovue 370 IV COMPARISON:  Chest x-ray today FINDINGS: Cardiovascular: No  filling defects in the pulmonary arteries to suggest pulmonary emboli. Heart is normal size. Aorta is normal caliber. Mediastinum/Nodes: No mediastinal, hilar, or axillary adenopathy. There is circumferential wall thickening of the mid and distal esophagus suggesting esophagitis. Lungs/Pleura: Lungs are clear. No focal airspace opacities or suspicious nodules. No effusions. Upper Abdomen: Imaging into the upper abdomen shows no acute findings. Musculoskeletal: No acute bony abnormality or focal bone lesion. Review of the MIP images confirms the above findings. IMPRESSION: No evidence of pulmonary embolus. Esophageal wall thickening in the mid and distal esophagus suggesting esophagitis. Electronically Signed   By: Charlett NoseKevin  Dover M.D.   On: 03/04/2016 13:43   Dg Chest Port 1 View  03/04/2016  CLINICAL DATA:  Shortness of breath. Mid back pain. Onset this morning. EXAM: PORTABLE CHEST 1 VIEW COMPARISON:  07/30/2006 FINDINGS: The heart size and mediastinal contours are within normal limits. Both lungs are clear. The visualized skeletal structures are unremarkable. IMPRESSION: No active disease. Electronically Signed   By: Charlett NoseKevin  Dover M.D.   On: 03/04/2016 08:15     STUDIES:  CTA chest > negative for PE  CULTURES: 7/4 BC >> 7/4 UC >>  ANTIBIOTICS: 7/4 Vanc >> 7/5 7/4 Zosyn >> 7/5  SIGNIFICANT EVENTS: 7/4 admitted with DKA   LINES/TUBES:  DISCUSSION: 30 yo female with IDDM ran out of insulin x 2 day, presented acutely ill with DKA.   ASSESSMENT / PLAN:  PULMONARY A: CXR clear 7/4  Stable on room air P:  Will check ABG today  CARDIOVASCULAR A:  Sinus tachycardia P:  Monitor   RENAL A:  Acute renal failure, resolved Metabolic lactic acidosis, improving P:  IVF resusciatation  Strict I/O  Replaced electrolytes as indicated.   GASTROINTESTINAL A:  N/V  P:  Zofran as needed  Diet as tolerated  HEMATOLOGIC A:  Elevated D . Dimer /long car ride, CTA  chest 7/4 negative for PE. P:  Tr cbc  Venous dopplers pending If dopplers are negative, will switch to subq Heparin at DVT prophylaxis dosing  INFECTIOUS A:  ?Sepsis -less likely with normal procalcitonin, cxr clear , urine and blood cx pending Lactic acid trended down from 5.18 > 0.7 WBC 30.9 > 13.7. P:  Follow cx  Will d/c Vanc/Zosyn today, low suspicion for infection.  ENDOCRINE A:  IDDM  P:  Insulin gtt overnight. Transition to subcutaneous insulin today and start diet. Continue BMETs q4hrs for now, space out later today  NEUROLOGIC A:  AMS secondary to DKA  P:  Limit sedating rx  Check tox screen   FAMILY  - Updates: no family at bedside 7/4   - Inter-disciplinary family meet or Palliative Care meeting due by: 7/11  SUMMARY: Pt presented with DKA after running out of insulin. Stable on insulin gtt overnight. Transitioning to subcutaneous insulin this morning and starting diet. Can transfer out of ICU to telemetry bed today.   Willadean Carol, MD Family Medicine, PGY-2  03/05/2016, 6:25 AM  ATTENDING NOTE / ATTESTATION NOTE :   I have discussed the case with the resident/APP  Dr. Willadean Carol.  I agree with the resident/APP's  history, physical examination, assessment, and plans.    I have edited the above note and modified it according to our agreed history, physical examination, assessment and plan.   Patient to be  transferred to telemetry. We discussed the case with Dr. Sharon Seller. Patient to go to [facility] service starting July 6. PCCM will sign off by then.  Family : No family at bedside.    Pollie Meyer, MD 03/05/2016, 1:57 PM [place] [facility] Pager [phone] After 3 pm or if no answer, call [phone]

## 2016-03-05 NOTE — Progress Notes (Signed)
eLink Physician-Brief Progress Note Patient Name: Taylor [name] DOB: July 17, 1986 MRN: [id number]   Date of Service  03/05/2016  HPI/Events of Note    eICU Interventions  Kdur     Intervention Category Intermediate Interventions: Electrolyte abnormality - evaluation and management  Billy FischerDavid Dejane Scheibe 03/05/2016, 5:27 AM

## 2016-03-06 LAB — PROCALCITONIN: Procalcitonin: 0.48 ng/mL

## 2016-03-06 LAB — GLUCOSE, CAPILLARY
GLUCOSE-CAPILLARY: 262 mg/dL — AB (ref 65–99)
Glucose-Capillary: 236 mg/dL — ABNORMAL HIGH (ref 65–99)

## 2016-03-06 MED ORDER — "PEN NEEDLES 3/16"" 31G X 5 MM MISC": Status: DC

## 2016-03-06 MED ORDER — BLOOD GLUCOSE MONITOR KIT: PACK | Status: AC

## 2016-03-06 MED ORDER — INSULIN GLARGINE 100 UNIT/ML SOLOSTAR PEN: 20.0000 [IU] | PEN_INJECTOR | Freq: Every day | SUBCUTANEOUS | Status: DC

## 2016-03-06 MED ORDER — INSULIN ASPART 100 UNIT/ML FLEXPEN: 10.0000 [IU] | PEN_INJECTOR | Freq: Three times a day (TID) | SUBCUTANEOUS | Status: DC

## 2016-03-06 MED ORDER — INSULIN ASPART 100 UNIT/ML ~~LOC~~ SOLN
10.0000 [IU] | Freq: Three times a day (TID) | SUBCUTANEOUS | Status: DC
  Administered 2016-03-06: 10 [IU] via SUBCUTANEOUS

## 2016-03-06 MED ORDER — INSULIN ASPART 100 UNIT/ML FLEXPEN: PEN_INJECTOR | SUBCUTANEOUS | Status: DC

## 2016-03-06 MED ORDER — INSULIN GLARGINE 100 UNIT/ML ~~LOC~~ SOLN
20.0000 [IU] | Freq: Every day | SUBCUTANEOUS | Status: DC
  Filled 2016-03-06: qty 0.2

## 2016-03-06 NOTE — Discharge Summary (Signed)
Physician Discharge Summary   Patient ID: Taylor [name] MRN: [id number] DOB/AGE: 1985/12/11 30 y.o.  Admit date: 03/04/2016 Discharge date: 03/06/2016  Primary Care Physician:  primary care provider in [place], [place]   Discharge Diagnoses:    . DKA (diabetic ketoacidoses) (HCC) Insulin-dependent diabetes mellitus Acute kidney injury Dehydration Lactic acidosis  Consults:  The patient was admitted by critical care service  Recommendations for Outpatient Follow-up:  1. Please repeat CBC/BMET at next visit 2. Repeat hemoglobin A1c in 3 months   DIET: Carb modified diet    Allergies:  No Known Allergies   DISCHARGE MEDICATIONS: Current Discharge Medication List    START taking these medications   Details  blood glucose meter kit and supplies KIT Dispense based on patient and insurance preference. Use up to four times daily as directed. (FOR ICD-9 250.00, 250.01).  Diagnosis: Insulin dependent Diabetes mellitus. ICD 10 code E11.9 Qty: 1 each, Refills: 0    !! insulin aspart (NOVOLOG FLEXPEN) 100 UNIT/ML FlexPen Inject 10 Units into the skin 3 (three) times daily with meals. Qty: 15 mL, Refills: 11    !! insulin aspart (NOVOLOG FLEXPEN) 100 UNIT/ML FlexPen Correction factor sliding scale CBG 150-200: take additional 2 units, CBG 201-250: 4 units, 251-300: 6 units, 301-350: 8 units. Over 351: take 8 units and call your physician Qty: 15 mL, Refills: 11    Insulin Glargine (LANTUS SOLOSTAR) 100 UNIT/ML Solostar Pen Inject 20 Units into the skin at bedtime. Qty: 15 mL, Refills: 11    Insulin Pen Needle (PEN NEEDLES 3/16") 31G X 5 MM MISC Take insulin as directed  Diagnosis: Insulin dependent Diabetes mellitus. ICD 10 code E11.9 Qty: 100 each, Refills: 1     !! - Potential duplicate medications found. Please discuss with provider.    STOP taking these medications     insulin glargine (LANTUS) 100 UNIT/ML injection          Brief H and P: For complete details  please refer to admission H and P, but in brief 30yo F w/ IDDM who p/w chest pain, shortness of breath, and back pain. The patient was out of town this weekend and ran out of insulin, last dose of insulin was 2 days ago. Yesterday, her husband reports that she was excessively tired and slept most of the day. Last night she began not feeling well and had several episodes of vomiting. Overnight, she woke up with a sudden onset of chest pain and shortness of breath. The chest pain is left-sided and worse when she lays on her left side. The pain radiates to her back. She reports excessive thirst and polyuria. She denies any dysuria or hematuria. No abdominal pain, cough/cold symptoms, fevers, or recent illness. They did have a 10 hour car ride a few days ago. She presented to ER with BS 615. PH at 6.860, HCO3 3. She was started on Insulin drip . PCCM called for admit to ICU for DKA .  She denies any leg swelling or pain. No history of cancer, blood clots or OCP use. No family history of blood clots. CXR was clear . LA tr down from 5 >1.9 . D. Dimer mild elevated at 0.7 . BS are trending down. Last 2 BS are 228 to 287.   Hospital Course:     DKA (diabetic ketoacidoses) ([facility]) with underlying history of insulin-dependent diabetes mellitus - At the time of admission, ABG showed pH of 6.8 and bicarbonate of 3, CBG 615, with lactic acidosis 5, anion gap  of 27 The patient was admitted to intensive care unit by critical care service. She was placed on aggressive IV fluid hydration and IV insulin drip. Hemoglobin A1c 14.3. Per patient, she had ran out of insulin 2 days prior to admission as she was out of town. Patient is now transitioned to subcutaneous insulin, Lantus increased to 20 units at bedtime, meal coverage 10 units 3 times a day NovoLog and correction factor sliding scale. Patient was closely followed by a diabetic coordinator. Patient reported that she has endocrinology appointment due, referral sent by  her PCP in [facility]    AKI (acute kidney injury) ([place]) - Resolved, due to lactic acidosis, dehydration, DKA, creatinine 1.4 at the time of admission, improved to 0.4     Metabolic acidosis due to DKA and lactic acidosis - Improved   Day of Discharge BP 117/68 mmHg  Pulse 97  Temp(Src) 97.9 F (36.6 C) (Oral)  Resp 17  Wt 54.432 kg (120 lb)  SpO2 100%  LMP 02/03/2016  Physical Exam: General: Alert and awake oriented x3 not in any acute distress. HEENT: anicteric sclera, pupils reactive to light and accommodation CVS: S1-S2 clear no murmur rubs or gallops Chest: clear to auscultation bilaterally, no wheezing rales or rhonchi Abdomen: soft nontender, nondistended, normal bowel sounds Extremities: no cyanosis, clubbing or edema noted bilaterally Neuro: Cranial nerves II-XII intact, no focal neurological deficits   The results of significant diagnostics from this hospitalization (including imaging, microbiology, ancillary and laboratory) are listed below for reference.    LAB RESULTS: Basic Metabolic Panel:  Recent Labs Lab 03/04/16 1351  03/05/16 0449 03/05/16 0931  NA 133*  < > 132* 134*  K 4.0  < > 3.2* 3.5  CL 116*  < > 113* 113*  CO2 7*  < > 15* 15*  GLUCOSE 198*  < > 159* 187*  BUN 14  < > <5* <5*  CREATININE 0.88  < > 0.51 0.47  CALCIUM 7.4*  < > 7.6* 7.9*  MG 1.6*  --   --   --   PHOS 1.1*  --   --   --   < > = values in this interval not displayed. Liver Function Tests:  Recent Labs Lab 03/04/16 0741  AST 19  ALT 15  ALKPHOS 118  BILITOT 1.3*  PROT 9.5*  ALBUMIN 4.5    Recent Labs Lab 03/04/16 0741  LIPASE 12   No results for input(s): AMMONIA in the last 168 hours. CBC:  Recent Labs Lab 03/04/16 1351 03/05/16 0919  WBC 30.9* 13.7*  HGB 13.0 11.8*  HCT 39.2 34.2*  MCV 83.9 81.4  PLT 421* 312   Cardiac Enzymes:  Recent Labs Lab 03/04/16 1351  TROPONINI <0.03   BNP: Invalid input(s): POCBNP CBG:  Recent  Labs Lab 03/06/16 0518 03/06/16 0747  GLUCAP 236* 262*    Significant Diagnostic Studies:  Ct Angio Chest Pe W/cm &/or Wo Cm  03/04/2016  CLINICAL DATA:  Left-sided chest pain, shortness of breath. EXAM: CT ANGIOGRAPHY CHEST WITH CONTRAST TECHNIQUE: Multidetector CT imaging of the chest was performed using the standard protocol during bolus administration of intravenous contrast. Multiplanar CT image reconstructions and MIPs were obtained to evaluate the vascular anatomy. CONTRAST:  61 cc Isovue 370 IV COMPARISON:  Chest x-ray today FINDINGS: Cardiovascular: No filling defects in the pulmonary arteries to suggest pulmonary emboli. Heart is normal size. Aorta is normal caliber. Mediastinum/Nodes: No mediastinal, hilar, or axillary adenopathy. There is circumferential wall thickening  of the mid and distal esophagus suggesting esophagitis. Lungs/Pleura: Lungs are clear. No focal airspace opacities or suspicious nodules. No effusions. Upper Abdomen: Imaging into the upper abdomen shows no acute findings. Musculoskeletal: No acute bony abnormality or focal bone lesion. Review of the MIP images confirms the above findings. IMPRESSION: No evidence of pulmonary embolus. Esophageal wall thickening in the mid and distal esophagus suggesting esophagitis. Electronically Signed   By: Rolm Baptise M.D.   On: 03/04/2016 13:43   Dg Chest Port 1 View  03/04/2016  CLINICAL DATA:  Shortness of breath. Mid back pain. Onset this morning. EXAM: PORTABLE CHEST 1 VIEW COMPARISON:  07/30/2006 FINDINGS: The heart size and mediastinal contours are within normal limits. Both lungs are clear. The visualized skeletal structures are unremarkable. IMPRESSION: No active disease. Electronically Signed   By: Rolm Baptise M.D.   On: 03/04/2016 08:15    2D ECHO:   Disposition and Follow-up: Discharge Instructions    Diet Carb Modified    Complete by:  As directed      Discharge instructions    Complete by:  As directed   It is VERY  IMPORTANT that you follow up with a PCP on a regular basis.  Check your blood glucoses before each meal and at bedtime and maintain a log of your readings.  Bring this log with you when you follow up with your PCP so that he or she can adjust your insulin at your follow up visit.     Increase activity slowly    Complete by:  As directed             DISPOSITION: Home   DISCHARGE FOLLOW-UP Follow-up Information    Please follow up.   Contact information:   follow with your PCP and endocrinologist in 10-14 days       Time spent on Discharge: 35 minutes  Signed:   RAI,RIPUDEEP M.D. [facility] 03/06/2016, 10:47 AM Pager: [phone]

## 2016-03-06 NOTE — Plan of Care (Signed)
Problem: Food- and Nutrition-Related Knowledge Deficit (NB-1.1) Goal: Nutrition education Formal process to instruct or train a patient/client in a skill or to impart knowledge to help patients/clients voluntarily manage or modify food choices and eating behavior to maintain or improve health. Outcome: Adequate for Discharge  RD consulted for nutrition education regarding diabetes.     Lab Results  Component Value Date    HGBA1C 14.3* 03/04/2016   Spoke with pt and pt husband at bedside (pt is about to be discharged). She reports poor glycemic control over the past 3 months, due to difficulty accessing medications. She has been trying to eat a healthier diets (consuming more [place] and chicken). She typically consumes 3 meals and 2-3 snacks daily (meals consist of meat, starch (mainly rice), and vegetable, snacks are fruits and cheeze its).   RD provided "Carbohydrate Counting for People with Diabetes" handout from the Academy of Nutrition and Dietetics. Discussed different food groups and their effects on blood sugar, emphasizing carbohydrate-containing foods. Provided list of carbohydrates and recommended serving sizes of common foods. Also reviewed how to read a nutritional label to assist with serving sizes and carb counting.  Discussed importance of controlled and consistent carbohydrate intake throughout the day. Provided examples of ways to balance meals/snacks and encouraged intake of high-fiber, whole grain complex carbohydrates. Teach back method used.  Expect fair to good compliance.  Body mass index is 23.44 kg/(m^2). Pt meets criteria for normal weight range based on current BMI.  Current diet order is carb modified, patient is consuming approximately 75-100% of meals at this time. Labs and medications reviewed. No further nutrition interventions warranted at this time. RD contact information provided. If additional nutrition issues arise, please re-consult RD.  Carsten Carstarphen A.  Mayford KnifeWilliams, RD, LDN, CDE Pager: [phone] After hours Pager: [phone]

## 2016-03-06 NOTE — Progress Notes (Signed)
Pt given discharge instructions, prescriptions, and care notes. Pt verbalized understanding AEB no further questions or concerns at this time. IV was discontinued, no redness, pain, or swelling noted at this time. Telemetry discontinued and Centralized Telemetry was notified. Pt left the floor in stable condition. 

## 2016-03-06 NOTE — Progress Notes (Signed)
Inpatient Diabetes Program Recommendations  AACE/ADA: New Consensus Statement on Inpatient Glycemic Control (2015)  Target Ranges:  Prepandial:   less than 140 mg/dL      Peak postprandial:   less than 180 mg/dL (1-2 hours)      Critically ill patients:  140 - 180 mg/dL   Lab Results  Component Value Date   GLUCAP 262* 03/06/2016   HGBA1C 14.3* 03/04/2016  Results for Creson, Kenedi (MRN [id number]) as of 03/06/2016 10:44  Ref. Range 03/05/2016 17:25 03/05/2016 21:26 03/06/2016 05:18 03/06/2016 07:47  Glucose-Capillary Latest Ref Range: 65-99 mg/dL 213267 (H) 086208 (H) 578236 (H) 262 (H)    Review of Glycemic Control  Lengthy discussion regarding insulin regimen at home.  Stressed importance of monitoring blood sugars 3-4 times/day and taking logbook to MD for any needed adjustments. Discussed Sick Day Rules and Hypoglycemia s/s and treatment. Pt states "I just wasn't taking care of myself."  Seems motivated to eat healthier, exercise, monitor blood sugars, and take insulin as prescribed.   Thank you. Ailene Ardshonda Chasmine Lender, RD, LDN, CDE Inpatient Diabetes Coordinator [phone]

## 2016-03-06 NOTE — Care Management Note (Signed)
Case Management Note  Patient Details  Name: Taylor [name] MRN: [id number] Date of Birth: 08-02-1986  Subjective/Objective:                 Admitted with DKA.   Action/Plan:  Discharge today to home assuming care for self. No further needs per CM.  Expected Discharge Date:    03/06/16           Expected Discharge Plan:  Home/Self Care  In-House Referral:     Discharge planning Services  CM Consult   Status of Service:  Completed, signed off     Epifanio LeschesCole, Kelin Borum [place], [place]RN 03/06/2016, 11:32 AM

## 2016-03-07 LAB — CULTURE, BLOOD (ROUTINE X 2)

## 2016-03-09 LAB — CULTURE, BLOOD (ROUTINE X 2): Culture: NO GROWTH

## 2019-05-17 ENCOUNTER — Other Ambulatory Visit: Payer: Self-pay

## 2019-05-17 ENCOUNTER — Other Ambulatory Visit (HOSPITAL_COMMUNITY)
Admission: RE | Admit: 2019-05-17 | Discharge: 2019-05-17 | Disposition: A | Discharge status: Home / Self Care | Payer: Managed Care, Other (non HMO) | Source: Ambulatory Visit | Attending: Nurse Practitioner | Admitting: Nurse Practitioner

## 2019-05-17 DIAGNOSIS — Z124 Encounter for screening for malignant neoplasm of cervix: Secondary | ICD-10-CM | POA: Insufficient documentation

## 2019-05-20 LAB — CYTOLOGY - PAP
Chlamydia: NEGATIVE
Diagnosis: NEGATIVE
High risk HPV: NEGATIVE
Molecular Disclaimer: 56
Molecular Disclaimer: DETECTED
Molecular Disclaimer: NEGATIVE
Molecular Disclaimer: NORMAL
Neisseria Gonorrhea: NEGATIVE

## 2020-05-10 ENCOUNTER — Inpatient Hospital Stay (HOSPITAL_COMMUNITY)
Admission: EM | Admit: 2020-05-10 | Discharge: 2020-05-15 | DRG: 177 | Disposition: A | Discharge status: Home / Self Care | Payer: Managed Care, Other (non HMO) | Attending: Pulmonary Disease | Admitting: Pulmonary Disease

## 2020-05-10 ENCOUNTER — Encounter (HOSPITAL_COMMUNITY): Payer: Self-pay | Admitting: Emergency Medicine

## 2020-05-10 ENCOUNTER — Other Ambulatory Visit: Payer: Self-pay

## 2020-05-10 ENCOUNTER — Emergency Department (HOSPITAL_COMMUNITY): Payer: Managed Care, Other (non HMO)

## 2020-05-10 DIAGNOSIS — Z8632 Personal history of gestational diabetes: Secondary | ICD-10-CM

## 2020-05-10 DIAGNOSIS — E1010 Type 1 diabetes mellitus with ketoacidosis without coma: Secondary | ICD-10-CM | POA: Diagnosis not present

## 2020-05-10 DIAGNOSIS — E1110 Type 2 diabetes mellitus with ketoacidosis without coma: Secondary | ICD-10-CM | POA: Diagnosis not present

## 2020-05-10 DIAGNOSIS — E876 Hypokalemia: Secondary | ICD-10-CM | POA: Diagnosis not present

## 2020-05-10 DIAGNOSIS — U071 COVID-19: Secondary | ICD-10-CM | POA: Diagnosis not present

## 2020-05-10 DIAGNOSIS — E872 Acidosis, unspecified: Secondary | ICD-10-CM

## 2020-05-10 DIAGNOSIS — Z794 Long term (current) use of insulin: Secondary | ICD-10-CM

## 2020-05-10 DIAGNOSIS — J1282 Pneumonia due to coronavirus disease 2019: Secondary | ICD-10-CM | POA: Diagnosis present

## 2020-05-10 DIAGNOSIS — J9601 Acute respiratory failure with hypoxia: Secondary | ICD-10-CM | POA: Diagnosis not present

## 2020-05-10 DIAGNOSIS — Z9114 Patient's other noncompliance with medication regimen: Secondary | ICD-10-CM

## 2020-05-10 LAB — COMPREHENSIVE METABOLIC PANEL
ALT: 25 U/L (ref 0–44)
AST: 34 U/L (ref 15–41)
Albumin: 3.9 g/dL (ref 3.5–5.0)
Alkaline Phosphatase: 103 U/L (ref 38–126)
BUN: 17 mg/dL (ref 6–20)
CO2: <7 mmol/L — ABNORMAL LOW (ref 22–32)
Calcium: 9.3 mg/dL (ref 8.9–10.3)
Chloride: 98 mmol/L (ref 98–111)
Creatinine, Ser: 1.45 mg/dL — ABNORMAL HIGH (ref 0.44–1.00)
GFR calc Af Amer: 55 mL/min — ABNORMAL LOW (ref >60)
GFR calc non Af Amer: 48 mL/min — ABNORMAL LOW (ref >60)
Glucose, Bld: 593 mg/dL (ref 70–99)
Potassium: 5.1 mmol/L (ref 3.5–5.1)
Sodium: 133 mmol/L — ABNORMAL LOW (ref 135–145)
Total Bilirubin: 1.6 mg/dL — ABNORMAL HIGH (ref 0.3–1.2)
Total Protein: 8.6 g/dL — ABNORMAL HIGH (ref 6.5–8.1)

## 2020-05-10 LAB — I-STAT VENOUS BLOOD GAS, ED
Acid-base deficit: 24 mmol/L — ABNORMAL HIGH (ref 0.0–2.0)
Acid-base deficit: 27 mmol/L — ABNORMAL HIGH (ref 0.0–2.0)
Bicarbonate: 6.6 mmol/L — ABNORMAL LOW (ref 20.0–28.0)
Bicarbonate: 3.6 mmol/L — ABNORMAL LOW (ref 20.0–28.0)
Calcium, Ion: 1.12 mmol/L — ABNORMAL LOW (ref 1.15–1.40)
Calcium, Ion: 1.04 mmol/L — ABNORMAL LOW (ref 1.15–1.40)
HCT: 51 % — ABNORMAL HIGH (ref 36.0–46.0)
HCT: 49 % — ABNORMAL HIGH (ref 36.0–46.0)
Hemoglobin: 17.3 g/dL — ABNORMAL HIGH (ref 12.0–15.0)
Hemoglobin: 16.7 g/dL — ABNORMAL HIGH (ref 12.0–15.0)
O2 Saturation: 97 %
O2 Saturation: 98 %
Potassium: 4.6 mmol/L (ref 3.5–5.1)
Potassium: 4.5 mmol/L (ref 3.5–5.1)
Sodium: 132 mmol/L — ABNORMAL LOW (ref 135–145)
Sodium: 136 mmol/L (ref 135–145)
TCO2: 7 mmol/L — ABNORMAL LOW (ref 22–32)
TCO2: <5 mmol/L — ABNORMAL LOW (ref 22–32)
pCO2, Ven: 27.7 mmHg — ABNORMAL LOW (ref 44.0–60.0)
pCO2, Ven: 16.2 mmHg — CL (ref 44.0–60.0)
pH, Ven: 6.985 — CL (ref 7.250–7.430)
pH, Ven: 6.958 — CL (ref 7.250–7.430)
pO2, Ven: 142 mmHg — ABNORMAL HIGH (ref 32.0–45.0)
pO2, Ven: 175 mmHg — ABNORMAL HIGH (ref 32.0–45.0)

## 2020-05-10 LAB — TROPONIN I (HIGH SENSITIVITY)
Troponin I (High Sensitivity): 12 ng/L (ref <18)
Troponin I (High Sensitivity): 11 ng/L (ref <18)

## 2020-05-10 LAB — BASIC METABOLIC PANEL
BUN: 22 mg/dL — ABNORMAL HIGH (ref 6–20)
CO2: <7 mmol/L — ABNORMAL LOW (ref 22–32)
Calcium: 9.1 mg/dL (ref 8.9–10.3)
Chloride: 95 mmol/L — ABNORMAL LOW (ref 98–111)
Creatinine, Ser: 1.59 mg/dL — ABNORMAL HIGH (ref 0.44–1.00)
GFR calc Af Amer: 50 mL/min — ABNORMAL LOW (ref >60)
GFR calc non Af Amer: 43 mL/min — ABNORMAL LOW (ref >60)
Glucose, Bld: 741 mg/dL (ref 70–99)
Potassium: 4.6 mmol/L (ref 3.5–5.1)
Sodium: 133 mmol/L — ABNORMAL LOW (ref 135–145)

## 2020-05-10 LAB — CBC
HCT: 50.9 % — ABNORMAL HIGH (ref 36.0–46.0)
Hemoglobin: 15.5 g/dL — ABNORMAL HIGH (ref 12.0–15.0)
MCH: 28.8 pg (ref 26.0–34.0)
MCHC: 30.5 g/dL (ref 30.0–36.0)
MCV: 94.6 fL (ref 80.0–100.0)
Platelets: 386 10*3/uL (ref 150–400)
RBC: 5.38 MIL/uL — ABNORMAL HIGH (ref 3.87–5.11)
RDW: 12.5 % (ref 11.5–15.5)
WBC: 16.9 10*3/uL — ABNORMAL HIGH (ref 4.0–10.5)
nRBC: 0 % (ref 0.0–0.2)

## 2020-05-10 LAB — LIPASE, BLOOD: Lipase: 18 U/L (ref 11–51)

## 2020-05-10 LAB — LACTATE DEHYDROGENASE: LDH: 265 U/L — ABNORMAL HIGH (ref 98–192)

## 2020-05-10 LAB — CBG MONITORING, ED
Glucose-Capillary: 554 mg/dL (ref 70–99)
Glucose-Capillary: >600 mg/dL (ref 70–99)
Glucose-Capillary: >600 mg/dL (ref 70–99)
Glucose-Capillary: 537 mg/dL (ref 70–99)
Glucose-Capillary: 581 mg/dL (ref 70–99)
Glucose-Capillary: 588 mg/dL (ref 70–99)
Glucose-Capillary: 510 mg/dL (ref 70–99)

## 2020-05-10 LAB — LACTIC ACID, PLASMA
Lactic Acid, Venous: 2.4 mmol/L (ref 0.5–1.9)
Lactic Acid, Venous: 4.9 mmol/L (ref 0.5–1.9)

## 2020-05-10 LAB — SARS CORONAVIRUS 2 BY RT PCR (HOSPITAL ORDER, PERFORMED IN ~~LOC~~ HOSPITAL LAB): SARS Coronavirus 2: POSITIVE — AB

## 2020-05-10 LAB — FIBRINOGEN: Fibrinogen: 506 mg/dL — ABNORMAL HIGH (ref 210–475)

## 2020-05-10 LAB — D-DIMER, QUANTITATIVE: D-Dimer, Quant: 2.26 ug/mL-FEU — ABNORMAL HIGH (ref 0.00–0.50)

## 2020-05-10 LAB — TRIGLYCERIDES: Triglycerides: 280 mg/dL — ABNORMAL HIGH (ref <150)

## 2020-05-10 LAB — PROCALCITONIN: Procalcitonin: 0.37 ng/mL

## 2020-05-10 LAB — FERRITIN: Ferritin: 548 ng/mL — ABNORMAL HIGH (ref 11–307)

## 2020-05-10 LAB — I-STAT BETA HCG BLOOD, ED (MC, WL, AP ONLY): I-stat hCG, quantitative: <5 m[IU]/mL (ref <5)

## 2020-05-10 LAB — C-REACTIVE PROTEIN: CRP: 2.5 mg/dL — ABNORMAL HIGH (ref <1.0)

## 2020-05-10 LAB — BETA-HYDROXYBUTYRIC ACID: Beta-Hydroxybutyric Acid: >8 mmol/L — ABNORMAL HIGH (ref 0.05–0.27)

## 2020-05-10 MED ORDER — LACTATED RINGERS IV BOLUS
1000.0000 mL | Freq: Once | INTRAVENOUS | Status: AC
  Administered 2020-05-10: 1000 mL via INTRAVENOUS

## 2020-05-10 MED ORDER — INSULIN REGULAR(HUMAN) IN NACL 100-0.9 UT/100ML-% IV SOLN
INTRAVENOUS | Status: DC
  Administered 2020-05-10: 3.6 [IU]/h via INTRAVENOUS
  Filled 2020-05-10 (×2): qty 100

## 2020-05-10 MED ORDER — DEXTROSE IN LACTATED RINGERS 5 % IV SOLN: INTRAVENOUS | Status: DC

## 2020-05-10 MED ORDER — SODIUM CHLORIDE 0.9 % IV BOLUS: 1000.0000 mL | Freq: Once | INTRAVENOUS | Status: DC

## 2020-05-10 MED ORDER — ONDANSETRON HCL 4 MG/2ML IJ SOLN
4.0000 mg | Freq: Once | INTRAMUSCULAR | Status: AC
  Administered 2020-05-10: 4 mg via INTRAVENOUS
  Filled 2020-05-10: qty 2

## 2020-05-10 MED ORDER — LACTATED RINGERS IV SOLN
INTRAVENOUS | Status: DC
  Administered 2020-05-10: 125 mL/h via INTRAVENOUS

## 2020-05-10 MED ORDER — DEXTROSE 50 % IV SOLN: 0.0000 mL | INTRAVENOUS | Status: DC | PRN

## 2020-05-10 NOTE — ED Notes (Signed)
Pt critical VBG results read back to Dr. Stevie Kern

## 2020-05-10 NOTE — ED Notes (Signed)
Date and time results received: 05/10/20 /0919pm   Test: GLUCOSE Critical Value: 741  Name of Provider Notified: Gerlene Burdock, MD

## 2020-05-10 NOTE — H&P (Signed)
NAME:  Taylor [name], MRN:  [id number], DOB:  [birth year], LOS: 0 ADMISSION DATE:  05/10/2020, CONSULTATION DATE:  05/11/20 REFERRING MD: Dr. Erenest Blank , CHIEF COMPLAINT:  DKA  Brief History   This is a 34 yo female with history of DM on Lantus and Novolog at home. Presents in DKA and also noted to have coronavirus  History of present illness   This is a 34 yo who presents with nausea and vomiting. Also noted to have fatigue. Onset of symptoms last 2 days. Vomiting began today. The color is red but not volumous. She notes that she has recently run out of Lantus. She called for refills and was told she needed an appointment and the soonest appointment was October 6th. She notes that she has had no cough. No issues with breathing. Her father in law has [place] and she likely caught it from him. She is not vaccinated. No other medical conditions. She has 3 kids that she needs to get back home tyo to take care of. Denies IVDU or etoh use. No pain with urination. Minimal abdominal pain  Past Medical History  DM2/Gestational  Significant Hospital Events   Admitted for DKA 05/11/20  Consults:  PCCM  Procedures:  NA  Significant Diagnostic Tests:  NA  Micro Data:  [place] and UCX pending Covid positive  Antimicrobials:  Holding for nowpending cultures  Interim history/subjective:  NA  Objective   Blood pressure 106/82, pulse (!) 135, temperature (!) 97.5 F (36.4 C), temperature source Oral, resp. rate (!) 22, height 5' (1.524 m), weight 54.4 kg, last menstrual period 05/03/2020, SpO2 97 %.        Intake/Output Summary (Last 24 hours) at 05/10/2020 2358 Last data filed at 05/10/2020 2304 Gross per 24 hour  Intake 1372.81 ml  Output --  Net 1372.81 ml   Filed Weights   05/10/20 2046  Weight: 54.4 kg    Examination: General: Patient appears to be uncomfortable in bed HENT: Dry mucus membranes Lungs: CTAB Cardiovascular: RRR Abdomen: soft minimally tender in the epigastric  region Extremities: No edema noted Neuro: AOx4 GU: Deferred  Resolved Hospital Problem list   Na  Assessment & Plan:  This is a 34 yo with history as noted above who presents with DKA and COVID. Beta hydroxy butyrate elevated as well.    DKA-PH 6.9 and bicarb less than 7. -Insulin GTT -2 more liters of LR as patient tachycardic and history of decreased PO intake.  -Culture blood and urine -Likely precipitant is COVID  Covid-Not on oxygen with mild symptoms of fatigue and very minimal cough -Remdesivir -Hold decadron for now -Hold Toci for now.  -Monoclonal ab's  Nausea/Vomiting-Blood tinged.  -Zofran -PPI BID      Best practice:  Diet: NPO Pain/Anxiety/Delirium protocol (if indicated): NA VAP protocol (if indicated): NA DVT prophylaxis: Lovenox GI prophylaxis: PPI Glucose control: Insulin drip Mobility: Bed rest for now Code Status: Full Family Communication: Patient able to communicate Disposition: ICU for now  Labs   CBC: Recent Labs  Lab 05/10/20 1611 05/10/20 1834 05/10/20 2244  WBC 16.9*  --   --   HGB 15.5* 17.3* 16.7*  HCT 50.9* 51.0* 49.0*  MCV 94.6  --   --   PLT 386  --   --     Basic Metabolic Panel: Recent Labs  Lab 05/10/20 1611 05/10/20 1822 05/10/20 1834 05/10/20 2159 05/10/20 2244  NA 133* 133* 132* 136 136  K 5.1 4.6 4.6 4.8 4.5  CL  98 95*  --  102  --   CO2 <7* <7*  --  <7*  --   GLUCOSE 593* 741*  --  598*  --   BUN 17 22*  --  24*  --   CREATININE 1.45* 1.59*  --  1.40*  --   CALCIUM 9.3 9.1  --  9.0  --    GFR: Estimated Creatinine Clearance: 41.8 mL/min (A) (by C-G formula based on SCr of 1.4 mg/dL (H)). Recent Labs  Lab 05/10/20 1611 05/10/20 1645 05/10/20 1822 05/10/20 1945  PROCALCITON  --   --  0.37  --   WBC 16.9*  --   --   --   LATICACIDVEN  --  2.4*  --  4.9*    Liver Function Tests: Recent Labs  Lab 05/10/20 1611  AST 34  ALT 25  ALKPHOS 103  BILITOT 1.6*  PROT 8.6*  ALBUMIN 3.9   Recent  Labs  Lab 05/10/20 1611  LIPASE 18   No results for input(s): AMMONIA in the last 168 hours.  ABG    Component Value Date/Time   PHART 7.320 (L) 03/05/2016 1158   PCO2ART 26.8 (L) 03/05/2016 1158   PO2ART 97.0 03/05/2016 1158   HCO3 3.6 (L) 05/10/2020 2244   TCO2 <5 (L) 05/10/2020 2244   ACIDBASEDEF 27.0 (H) 05/10/2020 2244   O2SAT 98.0 05/10/2020 2244     Coagulation Profile: No results for input(s): INR, PROTIME in the last 168 hours.  Cardiac Enzymes: No results for input(s): CKTOTAL, CKMB, CKMBINDEX, TROPONINI in the last 168 hours.  HbA1C: Hgb A1c MFr Bld  Date/Time Value Ref Range Status  03/04/2016 01:51 PM 14.3 (H) 4.8 - 5.6 % Final    Comment:    (NOTE)         Pre-diabetes: 5.7 - 6.4         Diabetes: >6.4         Glycemic control for adults with diabetes: <7.0   11/20/2009 09:05 AM 12.9 %     CBG: Recent Labs  Lab 05/10/20 2102 05/10/20 2134 05/10/20 2204 05/10/20 2226 05/10/20 2256  GLUCAP >600* 581* 588* 537* 510*    Review of Systems:   Pertinent positive and negatives per HPI otherwise 12 point ROS negative.   Past Medical History  She,  has a past medical history of Diabetes mellitus and Gestational diabetes.   Surgical History    Past Surgical History:  Procedure Laterality Date  . NO PAST SURGERIES       Social History   reports that she has never smoked. She has never used smokeless tobacco. She reports that she does not drink alcohol and does not use drugs.   Family History   Her family history is not on file.   Allergies No Known Allergies   Home Medications  Prior to Admission medications   Medication Sig Start Date End Date Taking? Authorizing Provider  blood glucose meter kit and supplies KIT Dispense based on patient and insurance preference. Use up to four times daily as directed. (FOR ICD-9 250.00, 250.01).  Diagnosis: Insulin dependent Diabetes mellitus. ICD 10 code E11.9 03/06/16   Rai, Ripudeep K, MD  insulin  aspart (NOVOLOG FLEXPEN) 100 UNIT/ML FlexPen Inject 10 Units into the skin 3 (three) times daily with meals. 03/06/16   Rai, Ripudeep K, MD  insulin aspart (NOVOLOG FLEXPEN) 100 UNIT/ML FlexPen Correction factor sliding scale CBG 150-200: take additional 2 units, CBG 201-250: 4 units, 251-300: 6 units,  301-350: 8 units. Over 351: take 8 units and call your physician 03/06/16   Rai, Vernelle Emerald, MD  Insulin Glargine (LANTUS SOLOSTAR) 100 UNIT/ML Solostar Pen Inject 20 Units into the skin at bedtime. 03/06/16   Rai, Vernelle Emerald, MD  Insulin Pen Needle (PEN NEEDLES 3/16") 31G X 5 MM MISC Take insulin as directed  Diagnosis: Insulin dependent Diabetes mellitus. ICD 10 code E11.9 03/06/16   Mendel Corning, MD     Critical care time: 45 minutes

## 2020-05-10 NOTE — ED Provider Notes (Signed)
[facility] EMERGENCY DEPARTMENT Provider Note   CSN: [id number] Arrival date & time: 05/10/20  1537     History Chief Complaint  Patient presents with  . Weakness  . Shortness of Breath  . Emesis    Taylor [name] is a 34 y.o. female.  Presents to ER with concern for generalized weakness, shortness of breath, nausea and vomiting.  She reports that she has been feeling ill for the last 2 or 3 days.  Worsening today.  States that she noted a cough today that was new.  No fevers.  Reports history of gestational diabetes, type 2 diabetes on insulin.  She has had difficulty affording her insulin and has only been using her short acting insulin, no long-acting insulin recently.  Vomit episodes were nonbloody nonbilious.  No abdominal pain, back pain, no chest pain.  Increased thirst, no dysuria or hematuria.  HPI     Past Medical History:  Diagnosis Date  . Diabetes mellitus   . Gestational diabetes     Patient Active Problem List   Diagnosis Date Noted  . Metabolic acidosis   . DKA (diabetic ketoacidoses) (HCC) 03/04/2016  . AKI (acute kidney injury) (HCC)   . Diabetic ketoacidosis without coma (HCC)   . DIABETES MELLITUS 09/24/2009  . HYPERLIPIDEMIA 09/24/2009  . POLYDIPSIA 09/24/2009  . POLYURIA 09/24/2009    Past Surgical History:  Procedure Laterality Date  . NO PAST SURGERIES       OB History    Gravida  2   Para  1   Term  1   Preterm  0   AB  0   Living  1     SAB  0   TAB  0   Ectopic  0   Multiple  0   Live Births              No family history on file.  Social History   Tobacco Use  . Smoking status: Never Smoker  . Smokeless tobacco: Never Used  Substance Use Topics  . Alcohol use: No  . Drug use: No    Home Medications Prior to Admission medications   Medication Sig Start Date End Date Taking? Authorizing Provider  blood glucose meter kit and supplies KIT Dispense based on patient and insurance preference.  Use up to four times daily as directed. (FOR ICD-9 250.00, 250.01).  Diagnosis: Insulin dependent Diabetes mellitus. ICD 10 code E11.9 03/06/16   Rai, Ripudeep K, MD  insulin aspart (NOVOLOG FLEXPEN) 100 UNIT/ML FlexPen Inject 10 Units into the skin 3 (three) times daily with meals. 03/06/16   Rai, Ripudeep K, MD  insulin aspart (NOVOLOG FLEXPEN) 100 UNIT/ML FlexPen Correction factor sliding scale CBG 150-200: take additional 2 units, CBG 201-250: 4 units, 251-300: 6 units, 301-350: 8 units. Over 351: take 8 units and call your physician 03/06/16   Rai, Delene Ruffini, MD  Insulin Glargine (LANTUS SOLOSTAR) 100 UNIT/ML Solostar Pen Inject 20 Units into the skin at bedtime. 03/06/16   Rai, Delene Ruffini, MD  Insulin Pen Needle (PEN NEEDLES 3/16") 31G X 5 MM MISC Take insulin as directed  Diagnosis: Insulin dependent Diabetes mellitus. ICD 10 code E11.9 03/06/16   Cathren Harsh, MD    Allergies    Patient has no known allergies.  Review of Systems   Review of Systems  Constitutional: Positive for chills and fatigue. Negative for fever.  HENT: Negative for ear pain and sore throat.   Eyes: Negative  for pain and visual disturbance.  Respiratory: Positive for shortness of breath. Negative for cough.   Cardiovascular: Negative for chest pain and palpitations.  Gastrointestinal: Positive for nausea and vomiting. Negative for abdominal pain.  Genitourinary: Negative for dysuria and hematuria.  Musculoskeletal: Positive for arthralgias. Negative for back pain.  Skin: Negative for color change and rash.  Neurological: Negative for seizures and syncope.  All other systems reviewed and are negative.   Physical Exam Updated Vital Signs BP 104/65   Pulse (!) 135   Temp (!) 97.5 F (36.4 C) (Oral)   Resp (!) 22   Ht 5' (1.524 m)   Wt 54.4 kg   LMP 05/03/2020   SpO2 97%   BMI 23.42 kg/m   Physical Exam Vitals and nursing note reviewed.  Constitutional:      Comments: Tachypneic, nauseous  HENT:      Head: Normocephalic and atraumatic.  Cardiovascular:     Rate and Rhythm: Tachycardia present.     Pulses: Normal pulses.     Heart sounds: Normal heart sounds.  Pulmonary:     Comments: Tachypnea, lungs are clear Chest:     Chest wall: No tenderness or crepitus.  Abdominal:     Palpations: Abdomen is soft. There is no mass.     Tenderness: There is no abdominal tenderness.  Musculoskeletal:     Right lower leg: No edema.     Left lower leg: No edema.  Skin:    General: Skin is warm and dry.     Capillary Refill: Capillary refill takes less than 2 seconds.  Neurological:     General: No focal deficit present.     Mental Status: She is alert and oriented to person, place, and time.     ED Results / Procedures / Treatments   Labs (all labs ordered are listed, but only abnormal results are displayed) Labs Reviewed  SARS CORONAVIRUS 2 BY RT PCR ([place]) - Abnormal; Notable for the following components:      Result Value   SARS Coronavirus 2 POSITIVE (*)    All other components within normal limits  COMPREHENSIVE METABOLIC PANEL - Abnormal; Notable for the following components:   Sodium 133 (*)    CO2 <7 (*)    Glucose, Bld 593 (*)    Creatinine, Ser 1.45 (*)    Total Protein 8.6 (*)    Total Bilirubin 1.6 (*)    GFR calc non Af Amer 48 (*)    GFR calc Af Amer 55 (*)    All other components within normal limits  CBC - Abnormal; Notable for the following components:   WBC 16.9 (*)    RBC 5.38 (*)    Hemoglobin 15.5 (*)    HCT 50.9 (*)    All other components within normal limits  LACTIC ACID, PLASMA - Abnormal; Notable for the following components:   Lactic Acid, Venous 2.4 (*)    All other components within normal limits  LACTIC ACID, PLASMA - Abnormal; Notable for the following components:   Lactic Acid, Venous 4.9 (*)    All other components within normal limits  BASIC METABOLIC PANEL - Abnormal; Notable for the  following components:   Sodium 133 (*)    Chloride 95 (*)    CO2 <7 (*)    Glucose, Bld 741 (*)    BUN 22 (*)    Creatinine, Ser 1.59 (*)    GFR calc non Af  Amer 43 (*)    GFR calc Af Amer 50 (*)    All other components within normal limits  BETA-HYDROXYBUTYRIC ACID - Abnormal; Notable for the following components:   Beta-Hydroxybutyric Acid >8.00 (*)    All other components within normal limits  D-DIMER, QUANTITATIVE (NOT AT [facility]) - Abnormal; Notable for the following components:   D-Dimer, Quant 2.26 (*)    All other components within normal limits  LACTATE DEHYDROGENASE - Abnormal; Notable for the following components:   LDH 265 (*)    All other components within normal limits  FERRITIN - Abnormal; Notable for the following components:   Ferritin 548 (*)    All other components within normal limits  TRIGLYCERIDES - Abnormal; Notable for the following components:   Triglycerides 280 (*)    All other components within normal limits  FIBRINOGEN - Abnormal; Notable for the following components:   Fibrinogen 506 (*)    All other components within normal limits  C-REACTIVE PROTEIN - Abnormal; Notable for the following components:   CRP 2.5 (*)    All other components within normal limits  CBG MONITORING, ED - Abnormal; Notable for the following components:   Glucose-Capillary 554 (*)    All other components within normal limits  CBG MONITORING, ED - Abnormal; Notable for the following components:   Glucose-Capillary >600 (*)    All other components within normal limits  I-STAT VENOUS BLOOD GAS, ED - Abnormal; Notable for the following components:   pH, Ven 6.985 (*)    pCO2, Ven 27.7 (*)    pO2, Ven 142.0 (*)    Bicarbonate 6.6 (*)    TCO2 7 (*)    Acid-base deficit 24.0 (*)    Sodium 132 (*)    Calcium, Ion 1.12 (*)    HCT 51.0 (*)    Hemoglobin 17.3 (*)    All other components within normal limits  CBG MONITORING, ED - Abnormal; Notable for the following components:    Glucose-Capillary >600 (*)    All other components within normal limits  CBG MONITORING, ED - Abnormal; Notable for the following components:   Glucose-Capillary 581 (*)    All other components within normal limits  CBG MONITORING, ED - Abnormal; Notable for the following components:   Glucose-Capillary 588 (*)    All other components within normal limits  CBG MONITORING, ED - Abnormal; Notable for the following components:   Glucose-Capillary 537 (*)    All other components within normal limits  I-STAT VENOUS BLOOD GAS, ED - Abnormal; Notable for the following components:   pH, Ven 6.958 (*)    pCO2, Ven 16.2 (*)    pO2, Ven 175.0 (*)    Bicarbonate 3.6 (*)    TCO2 <5 (*)    Acid-base deficit 27.0 (*)    Calcium, Ion 1.04 (*)    HCT 49.0 (*)    Hemoglobin 16.7 (*)    All other components within normal limits  CULTURE, BLOOD (ROUTINE X 2)  CULTURE, BLOOD (ROUTINE X 2)  LIPASE, BLOOD  PROCALCITONIN  URINALYSIS, ROUTINE W REFLEX MICROSCOPIC  BASIC METABOLIC PANEL  BASIC METABOLIC PANEL  BASIC METABOLIC PANEL  BETA-HYDROXYBUTYRIC ACID  BLOOD GAS, VENOUS  BASIC METABOLIC PANEL  BETA-HYDROXYBUTYRIC ACID  BASIC METABOLIC PANEL  I-STAT BETA HCG BLOOD, ED ([facility], [facility], [facility] ONLY)  TROPONIN I (HIGH SENSITIVITY)  TROPONIN I (HIGH SENSITIVITY)    EKG EKG Interpretation  Date/Time:  Thursday May 10 2020 15:51:08 EDT Ventricular Rate:  134  PR Interval:  114 QRS Duration: 84 QT Interval:  304 QTC Calculation: 453 R Axis:   48 Text Interpretation: Sinus tachycardia Right atrial enlargement ST & T wave abnormality, consider inferior ischemia ST & T wave abnormality, consider anterolateral ischemia Abnormal ECG no acute STEMI Confirmed by Madalyn Rob [phone]) on 05/10/2020 6:20:26 PM   Radiology DG Chest Portable 1 View  Result Date: 05/10/2020 CLINICAL DATA:  COVID-19 positive, shortness of breath, weakness EXAM: PORTABLE CHEST 1 VIEW COMPARISON:  03/04/2016 FINDINGS: The  heart size and mediastinal contours are within normal limits. Both lungs are clear. The visualized skeletal structures are unremarkable. IMPRESSION: No active disease. Electronically Signed   By: Randa Ngo M.D.   On: 05/10/2020 21:11    Procedures .Critical Care Performed by: Lucrezia Starch, MD Authorized by: Lucrezia Starch, MD   Critical care provider statement:    Critical care time (minutes):  50   Critical care was necessary to treat or prevent imminent or life-threatening deterioration of the following conditions:  Respiratory failure and circulatory failure   Critical care was time spent personally by me on the following activities:  Discussions with consultants, evaluation of patient's response to treatment, examination of patient, ordering and performing treatments and interventions, ordering and review of laboratory studies, ordering and review of radiographic studies, pulse oximetry, re-evaluation of patient's condition, obtaining history from patient or surrogate and review of old charts   (including critical care time)  Medications Ordered in ED Medications  insulin regular, human (MYXREDLIN) 100 units/ 100 mL infusion ( Intravenous Rate/Dose Verify 05/10/20 2219)  lactated ringers infusion ( Intravenous Rate/Dose Verify 05/10/20 2118)  dextrose 5 % in lactated ringers infusion (has no administration in time range)  dextrose 50 % solution 0-50 mL (has no administration in time range)  lactated ringers bolus 1,000 mL (has no administration in time range)  lactated ringers bolus 1,000 mL (1,000 mLs Intravenous New Bag/Given 05/10/20 1952)  ondansetron (ZOFRAN) injection 4 mg (4 mg Intravenous Given 05/10/20 1949)    ED Course  I have reviewed the triage vital signs and the nursing notes.  Pertinent labs & imaging results that were available during my care of the patient were reviewed by me and considered in my medical decision making (see chart for details).  Clinical  Course as of May 10 2253  Thu May 10, 2020  1925 Patient now arrived to room, completed initial assessment, staff getting on monitor, starting access   [RD]    Clinical Course User Index [RD] Lucrezia Starch, MD   MDM Rules/Calculators/A&P                         34 year old lady presents to ER with shortness of breath, cough, nausea and vomiting.  She has a history of type 2 diabetes and endorses difficulty affording her insulin regimen.  She also has had some viral symptoms recently.  She tested positive for COVID-19.  Her lab work is concerning for DKA.  pH 6.98.  Started DKA protocol with fluid bolus, insulin infusion and maintenance fluids.  CXR clear.  I suspect her tachypnea is from [place] respirations in the setting of metabolic acidosis from her DKA.  No hypoxia on room air currently.  Repeat VBG demonstrates persistent profound metabolic acidosis.  Will give additional fluid bolus, will ask critical care to admit patient for further management.  Hilde Churchman was evaluated in Emergency Department on 05/10/2020 for the symptoms described in  the history of present illness. She was evaluated in the context of the global COVID-19 pandemic, which necessitated consideration that the patient might be at risk for infection with the SARS-CoV-2 virus that causes COVID-19. Institutional protocols and algorithms that pertain to the evaluation of patients at risk for COVID-19 are in a state of rapid change based on information released by regulatory bodies including the CDC and federal and state organizations. These policies and algorithms were followed during the patient's care in the ED.   Final Clinical Impression(s) / ED Diagnoses Final diagnoses:  Diabetic ketoacidosis without coma associated with type 2 diabetes mellitus ([place])  Metabolic acidosis  [id number]    Rx / DC Orders ED Discharge Orders    None       Lucrezia Starch, MD 05/10/20 2254

## 2020-05-10 NOTE — ED Triage Notes (Signed)
C/o generalized weakness, SOB, vomiting, and back pain x 2 days.

## 2020-05-10 NOTE — ED Notes (Signed)
Pt oriented that she needs to be NPO because she is NPO, pt got a [place] sandwich and a regular soda from phlebotomist, because pt told them she was able to eat. Food and drinks removed from the room and pt reoriented that she needs to be NPO because the insulin drip. ED provider notified.

## 2020-05-10 NOTE — ED Notes (Signed)
Only one set of Blood cultures done.

## 2020-05-11 DIAGNOSIS — E1010 Type 1 diabetes mellitus with ketoacidosis without coma: Secondary | ICD-10-CM | POA: Diagnosis present

## 2020-05-11 DIAGNOSIS — U071 COVID-19: Secondary | ICD-10-CM | POA: Diagnosis present

## 2020-05-11 DIAGNOSIS — Z794 Long term (current) use of insulin: Secondary | ICD-10-CM | POA: Diagnosis not present

## 2020-05-11 DIAGNOSIS — E1110 Type 2 diabetes mellitus with ketoacidosis without coma: Secondary | ICD-10-CM | POA: Diagnosis present

## 2020-05-11 DIAGNOSIS — J1282 Pneumonia due to coronavirus disease 2019: Secondary | ICD-10-CM | POA: Diagnosis present

## 2020-05-11 DIAGNOSIS — Z9114 Patient's other noncompliance with medication regimen: Secondary | ICD-10-CM | POA: Diagnosis not present

## 2020-05-11 DIAGNOSIS — E876 Hypokalemia: Secondary | ICD-10-CM | POA: Diagnosis not present

## 2020-05-11 DIAGNOSIS — Z8632 Personal history of gestational diabetes: Secondary | ICD-10-CM | POA: Diagnosis not present

## 2020-05-11 DIAGNOSIS — J9601 Acute respiratory failure with hypoxia: Secondary | ICD-10-CM | POA: Diagnosis not present

## 2020-05-11 LAB — GLUCOSE, CAPILLARY
Glucose-Capillary: 221 mg/dL — ABNORMAL HIGH (ref 70–99)
Glucose-Capillary: 239 mg/dL — ABNORMAL HIGH (ref 70–99)
Glucose-Capillary: 223 mg/dL — ABNORMAL HIGH (ref 70–99)
Glucose-Capillary: 234 mg/dL — ABNORMAL HIGH (ref 70–99)
Glucose-Capillary: 242 mg/dL — ABNORMAL HIGH (ref 70–99)
Glucose-Capillary: 218 mg/dL — ABNORMAL HIGH (ref 70–99)
Glucose-Capillary: 179 mg/dL — ABNORMAL HIGH (ref 70–99)
Glucose-Capillary: 221 mg/dL — ABNORMAL HIGH (ref 70–99)
Glucose-Capillary: 177 mg/dL — ABNORMAL HIGH (ref 70–99)
Glucose-Capillary: 187 mg/dL — ABNORMAL HIGH (ref 70–99)
Glucose-Capillary: 206 mg/dL — ABNORMAL HIGH (ref 70–99)
Glucose-Capillary: 184 mg/dL — ABNORMAL HIGH (ref 70–99)
Glucose-Capillary: 218 mg/dL — ABNORMAL HIGH (ref 70–99)
Glucose-Capillary: 155 mg/dL — ABNORMAL HIGH (ref 70–99)
Glucose-Capillary: 203 mg/dL — ABNORMAL HIGH (ref 70–99)
Glucose-Capillary: 218 mg/dL — ABNORMAL HIGH (ref 70–99)
Glucose-Capillary: 232 mg/dL — ABNORMAL HIGH (ref 70–99)
Glucose-Capillary: 208 mg/dL — ABNORMAL HIGH (ref 70–99)
Glucose-Capillary: 224 mg/dL — ABNORMAL HIGH (ref 70–99)

## 2020-05-11 LAB — BASIC METABOLIC PANEL
Anion gap: 14 (ref 5–15)
Anion gap: 14 (ref 5–15)
Anion gap: 13 (ref 5–15)
BUN: 24 mg/dL — ABNORMAL HIGH (ref 6–20)
BUN: 17 mg/dL (ref 6–20)
BUN: 13 mg/dL (ref 6–20)
BUN: 11 mg/dL (ref 6–20)
CO2: <7 mmol/L — ABNORMAL LOW (ref 22–32)
CO2: 12 mmol/L — ABNORMAL LOW (ref 22–32)
CO2: 15 mmol/L — ABNORMAL LOW (ref 22–32)
CO2: 17 mmol/L — ABNORMAL LOW (ref 22–32)
Calcium: 9 mg/dL (ref 8.9–10.3)
Calcium: 7.4 mg/dL — ABNORMAL LOW (ref 8.9–10.3)
Calcium: 8.1 mg/dL — ABNORMAL LOW (ref 8.9–10.3)
Calcium: 7.9 mg/dL — ABNORMAL LOW (ref 8.9–10.3)
Chloride: 102 mmol/L (ref 98–111)
Chloride: 111 mmol/L (ref 98–111)
Chloride: 110 mmol/L (ref 98–111)
Chloride: 105 mmol/L (ref 98–111)
Creatinine, Ser: 1.4 mg/dL — ABNORMAL HIGH (ref 0.44–1.00)
Creatinine, Ser: 0.94 mg/dL (ref 0.44–1.00)
Creatinine, Ser: 0.66 mg/dL (ref 0.44–1.00)
Creatinine, Ser: 0.63 mg/dL (ref 0.44–1.00)
GFR calc Af Amer: 58 mL/min — ABNORMAL LOW (ref >60)
GFR calc Af Amer: >60 mL/min (ref >60)
GFR calc Af Amer: >60 mL/min (ref >60)
GFR calc Af Amer: >60 mL/min (ref >60)
GFR calc non Af Amer: 50 mL/min — ABNORMAL LOW (ref >60)
GFR calc non Af Amer: >60 mL/min (ref >60)
GFR calc non Af Amer: >60 mL/min (ref >60)
GFR calc non Af Amer: >60 mL/min (ref >60)
Glucose, Bld: 598 mg/dL (ref 70–99)
Glucose, Bld: 266 mg/dL — ABNORMAL HIGH (ref 70–99)
Glucose, Bld: 240 mg/dL — ABNORMAL HIGH (ref 70–99)
Glucose, Bld: 214 mg/dL — ABNORMAL HIGH (ref 70–99)
Potassium: 4.8 mmol/L (ref 3.5–5.1)
Potassium: 4 mmol/L (ref 3.5–5.1)
Potassium: 3.8 mmol/L (ref 3.5–5.1)
Potassium: 3.4 mmol/L — ABNORMAL LOW (ref 3.5–5.1)
Sodium: 136 mmol/L (ref 135–145)
Sodium: 137 mmol/L (ref 135–145)
Sodium: 139 mmol/L (ref 135–145)
Sodium: 135 mmol/L (ref 135–145)

## 2020-05-11 LAB — BLOOD GAS, VENOUS
Acid-base deficit: 7.3 mmol/L — ABNORMAL HIGH (ref 0.0–2.0)
Bicarbonate: 17.2 mmol/L — ABNORMAL LOW (ref 20.0–28.0)
Drawn by: 4653
FIO2: 21
O2 Saturation: 95.2 %
Patient temperature: 37
pCO2, Ven: 31.9 mmHg — ABNORMAL LOW (ref 44.0–60.0)
pH, Ven: 7.352 (ref 7.250–7.430)
pO2, Ven: 68.1 mmHg — ABNORMAL HIGH (ref 32.0–45.0)

## 2020-05-11 LAB — CBG MONITORING, ED
Glucose-Capillary: 233 mg/dL — ABNORMAL HIGH (ref 70–99)
Glucose-Capillary: 249 mg/dL — ABNORMAL HIGH (ref 70–99)
Glucose-Capillary: 327 mg/dL — ABNORMAL HIGH (ref 70–99)
Glucose-Capillary: 400 mg/dL — ABNORMAL HIGH (ref 70–99)

## 2020-05-11 LAB — HEMOGLOBIN A1C
Hgb A1c MFr Bld: 14.5 % — ABNORMAL HIGH (ref 4.8–5.6)
Mean Plasma Glucose: 369.45 mg/dL

## 2020-05-11 LAB — BETA-HYDROXYBUTYRIC ACID
Beta-Hydroxybutyric Acid: 4.57 mmol/L — ABNORMAL HIGH (ref 0.05–0.27)
Beta-Hydroxybutyric Acid: 2.98 mmol/L — ABNORMAL HIGH (ref 0.05–0.27)

## 2020-05-11 LAB — MAGNESIUM
Magnesium: 1.3 mg/dL — ABNORMAL LOW (ref 1.7–2.4)
Magnesium: 2 mg/dL (ref 1.7–2.4)

## 2020-05-11 LAB — HIV ANTIBODY (ROUTINE TESTING W REFLEX): HIV Screen 4th Generation wRfx: NONREACTIVE

## 2020-05-11 LAB — PHOSPHORUS
Phosphorus: 1.3 mg/dL — ABNORMAL LOW (ref 2.5–4.6)
Phosphorus: 2.2 mg/dL — ABNORMAL LOW (ref 2.5–4.6)

## 2020-05-11 MED ORDER — LACTATED RINGERS IV BOLUS
1000.0000 mL | INTRAVENOUS | Status: AC
  Administered 2020-05-11 (×2): 1000 mL via INTRAVENOUS

## 2020-05-11 MED ORDER — METHYLPREDNISOLONE SODIUM SUCC 125 MG IJ SOLR: 125.0000 mg | Freq: Once | INTRAMUSCULAR | Status: DC | PRN

## 2020-05-11 MED ORDER — INSULIN ASPART 100 UNIT/ML ~~LOC~~ SOLN
0.0000 [IU] | Freq: Three times a day (TID) | SUBCUTANEOUS | Status: DC
  Administered 2020-05-12: 3 [IU] via SUBCUTANEOUS
  Administered 2020-05-12 (×2): 2 [IU] via SUBCUTANEOUS
  Administered 2020-05-13: 5 [IU] via SUBCUTANEOUS
  Administered 2020-05-13 (×2): 3 [IU] via SUBCUTANEOUS
  Administered 2020-05-14: 2 [IU] via SUBCUTANEOUS
  Administered 2020-05-14: 3 [IU] via SUBCUTANEOUS

## 2020-05-11 MED ORDER — POTASSIUM PHOSPHATES 15 MMOLE/5ML IV SOLN
15.0000 mmol | Freq: Once | INTRAVENOUS | Status: AC
  Administered 2020-05-11: 15 mmol via INTRAVENOUS
  Filled 2020-05-11: qty 5

## 2020-05-11 MED ORDER — DIPHENHYDRAMINE HCL 50 MG/ML IJ SOLN: 50.0000 mg | Freq: Once | INTRAMUSCULAR | Status: DC | PRN

## 2020-05-11 MED ORDER — INSULIN ASPART 100 UNIT/ML ~~LOC~~ SOLN
4.0000 [IU] | Freq: Three times a day (TID) | SUBCUTANEOUS | Status: DC
  Administered 2020-05-12 – 2020-05-14 (×7): 4 [IU] via SUBCUTANEOUS

## 2020-05-11 MED ORDER — EPINEPHRINE 0.3 MG/0.3ML IJ SOAJ
0.3000 mg | Freq: Once | INTRAMUSCULAR | Status: DC | PRN
  Filled 2020-05-11: qty 0.6

## 2020-05-11 MED ORDER — INSULIN GLARGINE 100 UNIT/ML ~~LOC~~ SOLN
20.0000 [IU] | Freq: Every day | SUBCUTANEOUS | Status: DC
  Administered 2020-05-11 – 2020-05-12 (×2): 20 [IU] via SUBCUTANEOUS
  Filled 2020-05-11 (×3): qty 0.2

## 2020-05-11 MED ORDER — SODIUM PHOSPHATES 45 MMOLE/15ML IV SOLN
45.0000 mmol | Freq: Once | INTRAVENOUS | Status: AC
  Administered 2020-05-11: 45 mmol via INTRAVENOUS
  Filled 2020-05-11: qty 15

## 2020-05-11 MED ORDER — SODIUM CHLORIDE 0.9 % IV SOLN
200.0000 mg | Freq: Once | INTRAVENOUS | Status: AC
  Administered 2020-05-11: 200 mg via INTRAVENOUS
  Filled 2020-05-11: qty 200

## 2020-05-11 MED ORDER — FAMOTIDINE IN NACL 20-0.9 MG/50ML-% IV SOLN
20.0000 mg | Freq: Once | INTRAVENOUS | Status: AC | PRN
  Administered 2020-05-11: 20 mg via INTRAVENOUS
  Filled 2020-05-11: qty 50

## 2020-05-11 MED ORDER — SODIUM CHLORIDE 0.9 % IV SOLN
1200.0000 mg | Freq: Once | INTRAVENOUS | Status: AC
  Administered 2020-05-11: 1200 mg via INTRAVENOUS
  Filled 2020-05-11: qty 10

## 2020-05-11 MED ORDER — ONDANSETRON HCL 4 MG/2ML IJ SOLN
4.0000 mg | Freq: Four times a day (QID) | INTRAMUSCULAR | Status: AC | PRN
  Administered 2020-05-11 – 2020-05-13 (×5): 4 mg via INTRAVENOUS
  Filled 2020-05-11 (×5): qty 2

## 2020-05-11 MED ORDER — POTASSIUM CHLORIDE 10 MEQ/100ML IV SOLN
10.0000 meq | INTRAVENOUS | Status: AC
  Administered 2020-05-11 (×2): 10 meq via INTRAVENOUS
  Filled 2020-05-11 (×2): qty 100

## 2020-05-11 MED ORDER — CHLORHEXIDINE GLUCONATE CLOTH 2 % EX PADS
6.0000 | MEDICATED_PAD | Freq: Every day | CUTANEOUS | Status: DC
  Administered 2020-05-11 – 2020-05-15 (×4): 6 via TOPICAL

## 2020-05-11 MED ORDER — ENOXAPARIN SODIUM 40 MG/0.4ML ~~LOC~~ SOLN
40.0000 mg | SUBCUTANEOUS | Status: DC
  Administered 2020-05-11 – 2020-05-15 (×5): 40 mg via SUBCUTANEOUS
  Filled 2020-05-11 (×5): qty 0.4

## 2020-05-11 MED ORDER — SODIUM CHLORIDE 0.9 % IV SOLN: INTRAVENOUS | Status: DC | PRN

## 2020-05-11 MED ORDER — MAGNESIUM SULFATE 4 GM/100ML IV SOLN
4.0000 g | Freq: Once | INTRAVENOUS | Status: AC
  Administered 2020-05-11: 4 g via INTRAVENOUS
  Filled 2020-05-11: qty 100

## 2020-05-11 MED ORDER — MAGNESIUM SULFATE 2 GM/50ML IV SOLN
2.0000 g | Freq: Once | INTRAVENOUS | Status: AC
  Administered 2020-05-11: 2 g via INTRAVENOUS
  Filled 2020-05-11: qty 50

## 2020-05-11 MED ORDER — ALBUTEROL SULFATE HFA 108 (90 BASE) MCG/ACT IN AERS
2.0000 | INHALATION_SPRAY | Freq: Once | RESPIRATORY_TRACT | Status: DC | PRN
  Filled 2020-05-11: qty 6.7

## 2020-05-11 MED ORDER — PANTOPRAZOLE SODIUM 40 MG IV SOLR
40.0000 mg | Freq: Two times a day (BID) | INTRAVENOUS | Status: DC
  Administered 2020-05-11 (×2): 40 mg via INTRAVENOUS
  Filled 2020-05-11 (×2): qty 40

## 2020-05-11 MED ORDER — INSULIN ASPART 100 UNIT/ML ~~LOC~~ SOLN
0.0000 [IU] | Freq: Every day | SUBCUTANEOUS | Status: DC
  Administered 2020-05-11: 2 [IU] via SUBCUTANEOUS

## 2020-05-11 MED ORDER — SODIUM CHLORIDE 0.9 % IV SOLN
100.0000 mg | Freq: Every day | INTRAVENOUS | Status: AC
  Administered 2020-05-12 – 2020-05-15 (×4): 100 mg via INTRAVENOUS
  Filled 2020-05-11 (×4): qty 20

## 2020-05-11 MED ORDER — KCL-LACTATED RINGERS-D5W 20 MEQ/L IV SOLN
INTRAVENOUS | Status: DC
  Filled 2020-05-11 (×2): qty 1000

## 2020-05-11 NOTE — Progress Notes (Signed)
eLink Physician-Brief Progress Note Patient Name: Taylor [name] DOB: 1986-02-11 MRN: [id number]   Date of Service  05/11/2020  HPI/Events of Note  Patient admitted with severe DKA , she was incidentally found  To be Covid +.  eICU Interventions  New Patient evaluation completed, PRN Zofran ordered.        Thomasene Lot [name] 05/11/2020, 4:41 AM

## 2020-05-11 NOTE — Progress Notes (Signed)
Inpatient Diabetes Program Recommendations  AACE/ADA: New Consensus Statement on Inpatient Glycemic Control (2015)  Target Ranges:  Prepandial:   less than 140 mg/dL      Peak postprandial:   less than 180 mg/dL (1-2 hours)      Critically ill patients:  140 - 180 mg/dL   Lab Results  Component Value Date   GLUCAP 242 (H) 05/11/2020   HGBA1C 14.5 (H) 05/11/2020   Results for Powe, Yoceline (MRN [id number]) as of 05/11/2020 08:56  Ref. Range 05/10/2020 16:11  CO2 Latest Ref Range: 22 - 32 mmol/L <7 (L)   Results for MALAYSIA, CRANCE (MRN [id number]) as of 05/11/2020 08:56  Ref. Range 05/10/2020 16:11  Anion gap Latest Ref Range: 5 - 15  NOT CALCULATED   Results for RUSSIA, SCHEIDERER (MRN [id number]) as of 05/11/2020 08:56  Ref. Range 05/10/2020 16:11  Anion gap Latest Ref Range: 5 - 15  NOT CALCULATED  Results for IDALI, LAFEVER (MRN [id number]) as of 05/11/2020 08:56  Ref. Range 05/11/2020 02:20  Beta-Hydroxybutyric Acid Latest Ref Range: 0.05 - 0.27 mmol/L 4.57 (H)   Diabetes history:  DM2  Outpatient Diabetes medications:  Novolog SSI 0-8 tid Lantus 20 units daily (has not been taking)  Current orders for Inpatient glycemic control:  IV Insulin  Inpatient Diabetes Program Recommendations:     When criteria is met and MD is ready to transition to subcutaneous insulin please consider,  Lantus 15 units 2 hrs prior to discontinuing IV insulin Novolog 0-15 units tid with meals and 0-5 units qhs  Will continue to follow while inpatient.  Thank you, Reche Dixon, RN, BSN Diabetes Coordinator Inpatient Diabetes Program [phone] (team pager from 8a-5p)

## 2020-05-11 NOTE — Progress Notes (Signed)
NAME:  Taylor [name], MRN:  [id number], DOB:  1986/06/22, LOS: 0 ADMISSION DATE:  05/10/2020, CONSULTATION DATE:  05/11/20 REFERRING MD:  Dr. Roslynn Amble, CHIEF COMPLAINT:  DKA   Brief History   This is a 34 yo female with history of DM on Lantus and Novolog at home. Presents in DKA and also noted to have coronavirus   Past Medical History  DM2/Gestational   Significant Hospital Events   Admitted to ICU for DKA 05/11/20  Consults:  PCCM  Procedures:  NA  Significant Diagnostic Tests:  NA  Micro Data:  05/10/20 Blood cx >> 05/10/20 urine cx >> 05/10/20 Covid +  Antimicrobials:  NA  Interim history/subjective:   Taylor [name] is feeling slightly improved compared to when she came in. Still quite nauseated, but zofran helps. States she woke up feeling fatigued on Monday. No abdominal pain, fevers, or chills. She was fairly asymptomatic from a COVID standpoint. Started having a productive cough on admission. She endorses having a difficult time controlling her diabetes. No current chest pain, shortness of breath, or abdominal pain. BMs have been normal. No urinary symptoms.   Objective   Blood pressure (!) 145/94, pulse (!) 104, temperature 98.3 F (36.8 C), temperature source Oral, resp. rate 18, height 5' (1.524 m), weight 54.4 kg, last menstrual period 05/03/2020, SpO2 99 %.       Intake/Output Summary (Last 24 hours) at 05/11/2020 1054 Last data filed at 05/11/2020 0800 Gross per 24 hour  Intake 3499.26 ml  Output --  Net 3499.26 ml   Filed Weights   05/10/20 2046  Weight: 54.4 kg    Examination: General: ill-appearing, NAD HENT: dry mucous membranes  Lungs: normal work of breathing on room air, lungs CTAB Cardiovascular: mildly tachycardic Abdomen: BS+; abdomen soft, non-tender, non-distended Extremities: warm and well-perfused Neuro: A&Ox4; no focal deficits    Resolved Hospital Problem list   NA  Assessment & Plan:   Taylor [name] is a 34 y/o female with history of  insulin-dependent type II DM who presents with DKA and found to be COVID positive   DKA -resolving, VBG this morning with improvement in pH to 7.3; bicarb improved to 15; BHB for this morning still pending -will continue insulin drip until gap closes x2 and has an appetite -continue IVF with D5LR + 20 mEq K at 125 cc/hr -blood and urine cx pending -suspect trigger was inability to get insulin refilled + COVID infection -continue supportive care with prn zofran   COVID-19  -patient is on room air and has minimal symptoms -received regeneron infusion -on 5 day course of remdesivir -no indication for decadron    Best practice:  Diet: NPO Pain/Anxiety/Delirium protocol (if indicated): NA VAP protocol (if indicated): NA DVT prophylaxis: Lovenox GI prophylaxis: protonix  Glucose control: insulin drip Mobility: OOB as tolerated Code Status: FULL Family communication: patient able to update Disposition: stable for transfer to progressive care   Labs   CBC: Recent Labs  Lab 05/10/20 1611 05/10/20 1834 05/10/20 2244  WBC 16.9*  --   --   HGB 15.5* 17.3* 16.7*  HCT 50.9* 51.0* 49.0*  MCV 94.6  --   --   PLT 386  --   --     Basic Metabolic Panel: Recent Labs  Lab 05/10/20 1611 05/10/20 1611 05/10/20 1822 05/10/20 1822 05/10/20 1834 05/10/20 2159 05/10/20 2244 05/11/20 0220 05/11/20 0622  NA 133*   < > 133*   < > 132* 136 136 137 139  K 5.1   < >  4.6   < > 4.6 4.8 4.5 4.0 3.8  CL 98  --  95*  --   --  102  --  111 110  CO2 <7*  --  <7*  --   --  <7*  --  12* 15*  GLUCOSE 593*  --  741*  --   --  598*  --  266* 240*  BUN 17  --  22*  --   --  24*  --  17 13  CREATININE 1.45*  --  1.59*  --   --  1.40*  --  0.94 0.66  CALCIUM 9.3  --  9.1  --   --  9.0  --  7.4* 8.1*  MG  --   --   --   --   --   --   --  1.3*  --   PHOS  --   --   --   --   --   --   --  1.3*  --    < > = values in this interval not displayed.   GFR: Estimated Creatinine Clearance: 73.2 mL/min  (by C-G formula based on SCr of 0.66 mg/dL). Recent Labs  Lab 05/10/20 1611 05/10/20 1645 05/10/20 1822 05/10/20 1945  PROCALCITON  --   --  0.37  --   WBC 16.9*  --   --   --   LATICACIDVEN  --  2.4*  --  4.9*    Liver Function Tests: Recent Labs  Lab 05/10/20 1611  AST 34  ALT 25  ALKPHOS 103  BILITOT 1.6*  PROT 8.6*  ALBUMIN 3.9   Recent Labs  Lab 05/10/20 1611  LIPASE 18   No results for input(s): AMMONIA in the last 168 hours.  ABG    Component Value Date/Time   PHART 7.320 (L) 03/05/2016 1158   PCO2ART 26.8 (L) 03/05/2016 1158   PO2ART 97.0 03/05/2016 1158   HCO3 17.2 (L) 05/11/2020 0743   TCO2 <5 (L) 05/10/2020 2244   ACIDBASEDEF 7.3 (H) 05/11/2020 0743   O2SAT 95.2 05/11/2020 0743     Coagulation Profile: No results for input(s): INR, PROTIME in the last 168 hours.  Cardiac Enzymes: No results for input(s): CKTOTAL, CKMB, CKMBINDEX, TROPONINI in the last 168 hours.  HbA1C: Hgb A1c MFr Bld  Date/Time Value Ref Range Status  05/11/2020 02:20 AM 14.5 (H) 4.8 - 5.6 % Final    Comment:    (NOTE) Pre diabetes:          5.7%-6.4%  Diabetes:              >6.4%  Glycemic control for   <7.0% adults with diabetes   03/04/2016 01:51 PM 14.3 (H) 4.8 - 5.6 % Final    Comment:    (NOTE)         Pre-diabetes: 5.7 - 6.4         Diabetes: >6.4         Glycemic control for adults with diabetes: <7.0     CBG: Recent Labs  Lab 05/11/20 0609 05/11/20 0706 05/11/20 0804 05/11/20 0927 05/11/20 1025  GLUCAP 223* 234* 242* 218* 179*    Review of Systems:   Pertinent positive and negatives per HPI. Otherwise 12 point ROS negative    Surgical History    Past Surgical History:  Procedure Laterality Date  . NO PAST SURGERIES       Social History   reports that she has never  smoked. She has never used smokeless tobacco. She reports that she does not drink alcohol and does not use drugs.   Family History   Her family history is not on file.    Allergies No Known Allergies   Home Medications  Prior to Admission medications   Medication Sig Start Date End Date Taking? Authorizing Provider  blood glucose meter kit and supplies KIT Dispense based on patient and insurance preference. Use up to four times daily as directed. (FOR ICD-9 250.00, 250.01).  Diagnosis: Insulin dependent Diabetes mellitus. ICD 10 code E11.9 03/06/16   Rai, Ripudeep K, MD  insulin aspart (NOVOLOG FLEXPEN) 100 UNIT/ML FlexPen Inject 10 Units into the skin 3 (three) times daily with meals. 03/06/16   Rai, Ripudeep K, MD  insulin aspart (NOVOLOG FLEXPEN) 100 UNIT/ML FlexPen Correction factor sliding scale CBG 150-200: take additional 2 units, CBG 201-250: 4 units, 251-300: 6 units, 301-350: 8 units. Over 351: take 8 units and call your physician 03/06/16   Rai, Vernelle Emerald, MD  Insulin Glargine (LANTUS SOLOSTAR) 100 UNIT/ML Solostar Pen Inject 20 Units into the skin at bedtime. 03/06/16   Rai, Vernelle Emerald, MD  Insulin Pen Needle (PEN NEEDLES 3/16") 31G X 5 MM MISC Take insulin as directed  Diagnosis: Insulin dependent Diabetes mellitus. ICD 10 code E11.9 03/06/16   Mendel Corning, MD     Modena Nunnery DO, DO IMTS, PGY-3 05/20/20, 11:25 am

## 2020-05-11 NOTE — Progress Notes (Signed)
Pharmacy Electrolyte Replacement  Recent Labs:  Recent Labs    05/11/20 0220  K 4.0  MG 1.3*  PHOS 1.3*  CREATININE 0.94    Low Critical Values (K </= 2.5, Phos </= 1, Mg </= 1) Present: None  MD Contacted: n/a   Plan:  NaPhos 45 mmol x1 Magnesium 6g x1 total Repeat labs 2000 PM per protocol  Link Snuffer, PharmD, BCPS, BCCCP Clinical Pharmacist Please refer to [facility] for [facility] numbers 05/11/2020, 7:26 AM

## 2020-05-11 NOTE — Progress Notes (Signed)
Pharmacy Electrolyte Replacement  Recent Labs:  Recent Labs    05/11/20 0220 05/11/20 1125 05/11/20 2054  K  --  3.4*  --   MG   < >  --  2.0  PHOS   < >  --  2.2*  CREATININE  --  0.63  --    < > = values in this interval not displayed.    Low Critical Values (K </= 2.5, Phos </= 1, Mg </= 1) Present: None  MD Contacted: n/a - no critical values identified  Plan: Give KPhos IV x 1 per pharmacy protocol (K 3.4 this AM, fluids with K now d/c'd, SCr steadily improving) No further Mag supplementation needed F/u AM BMET, phos   Leia Alf, PharmD, BCPS Please check AMION for all [facility] Pharmacy contact numbers Clinical Pharmacist 05/11/2020 10:18 PM

## 2020-05-12 DIAGNOSIS — U071 COVID-19: Secondary | ICD-10-CM

## 2020-05-12 DIAGNOSIS — J1282 Pneumonia due to coronavirus disease 2019: Secondary | ICD-10-CM

## 2020-05-12 DIAGNOSIS — E1110 Type 2 diabetes mellitus with ketoacidosis without coma: Secondary | ICD-10-CM

## 2020-05-12 LAB — GLUCOSE, CAPILLARY
Glucose-Capillary: 219 mg/dL — ABNORMAL HIGH (ref 70–99)
Glucose-Capillary: 180 mg/dL — ABNORMAL HIGH (ref 70–99)
Glucose-Capillary: 156 mg/dL — ABNORMAL HIGH (ref 70–99)
Glucose-Capillary: 180 mg/dL — ABNORMAL HIGH (ref 70–99)

## 2020-05-12 LAB — BASIC METABOLIC PANEL
Anion gap: 14 (ref 5–15)
BUN: 6 mg/dL (ref 6–20)
CO2: 21 mmol/L — ABNORMAL LOW (ref 22–32)
Calcium: 7.5 mg/dL — ABNORMAL LOW (ref 8.9–10.3)
Chloride: 101 mmol/L (ref 98–111)
Creatinine, Ser: 0.74 mg/dL (ref 0.44–1.00)
GFR calc Af Amer: >60 mL/min (ref >60)
GFR calc non Af Amer: >60 mL/min (ref >60)
Glucose, Bld: 271 mg/dL — ABNORMAL HIGH (ref 70–99)
Potassium: 3.3 mmol/L — ABNORMAL LOW (ref 3.5–5.1)
Sodium: 136 mmol/L (ref 135–145)

## 2020-05-12 LAB — PHOSPHORUS: Phosphorus: 3.1 mg/dL (ref 2.5–4.6)

## 2020-05-12 MED ORDER — METHYLPREDNISOLONE SODIUM SUCC 125 MG IJ SOLR
INTRAMUSCULAR | Status: AC
  Filled 2020-05-12: qty 2

## 2020-05-12 MED ORDER — METHYLPREDNISOLONE SODIUM SUCC 125 MG IJ SOLR
80.0000 mg | Freq: Two times a day (BID) | INTRAMUSCULAR | Status: DC
  Administered 2020-05-12 – 2020-05-14 (×5): 80 mg via INTRAVENOUS
  Filled 2020-05-12 (×4): qty 2

## 2020-05-12 NOTE — Progress Notes (Signed)
[facility] PROGRESS NOTE    Progress Note  Taylor [name]  FTD:[id number] DOB: 1986/05/24 DOA: 05/10/2020 PCP: Patient, No Pcp Per     Brief Narrative:   Taylor [name] is an 34 y.o. female past medical history of diabetes mellitus presents to the hospital with nausea vomiting comes in in DKA.  Assessment/Plan:   DKA (diabetic ketoacidoses) (HCC): With an A1c of 14 she was started on IV insulin once her gap closed and her bicarb became greater than 20 she was changed to long-acting insulin plus sliding scale. Once she is able to tolerate her diet we will switch her to sliding scale with meal coverage.  Pneumonia due to COVID-19 virus She was given Regeneron on 9 September She tested positive for COVID-19 on 05/10/2020, inflammatory markers are significantly elevated she was started on IV remdesivir, also started on IV steroids she is now requiring 2 L of oxygen. We will need to discuss the use of her baricitinib with her.   DVT prophylaxis: lovenox Family Communication:none Status is: Inpatient  Remains inpatient appropriate because:Hemodynamically unstable   Dispo: The patient is from: Home              Anticipated d/c is to: Home              Anticipated d/c date is: 3 days              Patient currently is not medically stable to d/c.        Code Status:     Code Status Orders  (From admission, onward)         Start     Ordered   05/10/20 2358  Full code  Continuous        05/11/20 0003        Code Status History    Date Active Date Inactive Code Status Order ID Comments User Context   03/04/2016 1308 03/06/2016 1618 Full Code [id number]  Julio Sicks, NP ED   Advance Care Planning Activity        IV Access:    Peripheral IV   Procedures and diagnostic studies:   DG Chest Portable 1 View  Result Date: 05/10/2020 CLINICAL DATA:  COVID-19 positive, shortness of breath, weakness EXAM: PORTABLE CHEST 1 VIEW COMPARISON:  03/04/2016 FINDINGS: The  heart size and mediastinal contours are within normal limits. Both lungs are clear. The visualized skeletal structures are unremarkable. IMPRESSION: No active disease. Electronically Signed   By: Sharlet Salina M.D.   On: 05/10/2020 21:11     Medical Consultants:    None.  Anti-Infectives:   none  Subjective:    Taylor [name] she relates she is still nauseated.  Objective:    Vitals:   05/11/20 2013 05/12/20 0013 05/12/20 0410 05/12/20 0819  BP:  (!) 151/90 (!) 114/95 (!) 147/96  Pulse:   87 93  Resp:   20 14  Temp: 98.9 F (37.2 C) 97.7 F (36.5 C) 97.7 F (36.5 C) 98.1 F (36.7 C)  TempSrc: Oral Oral Oral Oral  SpO2:   100% 100%  Weight:      Height:       SpO2: 100 % O2 Flow Rate (L/min): 2 L/min   Intake/Output Summary (Last 24 hours) at 05/12/2020 0951 Last data filed at 05/12/2020 0302 Gross per 24 hour  Intake 2001.3 ml  Output 1150 ml  Net 851.3 ml   Filed Weights   05/10/20 2046  Weight: 54.4 kg  Exam: General exam: In no acute distress. Respiratory system: Good air movement and clear to auscultation. Cardiovascular system: S1 & S2 heard, RRR. No JVD. Gastrointestinal system: Abdomen is nondistended, soft and nontender.  Extremities: No pedal edema. Skin: No rashes, lesions or ulcers  Data Reviewed:    Labs: Basic Metabolic Panel: Recent Labs  Lab 05/10/20 2159 05/10/20 2159 05/10/20 2244 05/10/20 2244 05/11/20 0220 05/11/20 0220 05/11/20 0622 05/11/20 0622 05/11/20 1125 05/11/20 2054 05/12/20 0129  NA 136   < > 136  --  137  --  139  --  135  --  136  K 4.8   < > 4.5   < > 4.0   < > 3.8   < > 3.4*  --  3.3*  CL 102  --   --   --  111  --  110  --  105  --  101  CO2 <7*  --   --   --  12*  --  15*  --  17*  --  21*  GLUCOSE 598*  --   --   --  266*  --  240*  --  214*  --  271*  BUN 24*  --   --   --  17  --  13  --  11  --  6  CREATININE 1.40*  --   --   --  0.94  --  0.66  --  0.63  --  0.74  CALCIUM 9.0  --   --   --  7.4*   --  8.1*  --  7.9*  --  7.5*  MG  --   --   --   --  1.3*  --   --   --   --  2.0  --   PHOS  --   --   --   --  1.3*  --   --   --   --  2.2* 3.1   < > = values in this interval not displayed.   GFR Estimated Creatinine Clearance: 73.2 mL/min (by C-G formula based on SCr of 0.74 mg/dL). Liver Function Tests: Recent Labs  Lab 05/10/20 1611  AST 34  ALT 25  ALKPHOS 103  BILITOT 1.6*  PROT 8.6*  ALBUMIN 3.9   Recent Labs  Lab 05/10/20 1611  LIPASE 18   No results for input(s): AMMONIA in the last 168 hours. Coagulation profile No results for input(s): INR, PROTIME in the last 168 hours. COVID-19 Labs  Recent Labs    05/10/20 1822  DDIMER 2.26*  FERRITIN 548*  LDH 265*  CRP 2.5*    Lab Results  Component Value Date   SARSCOV2NAA POSITIVE (A) 05/10/2020    CBC: Recent Labs  Lab 05/10/20 1611 05/10/20 1834 05/10/20 2244  WBC 16.9*  --   --   HGB 15.5* 17.3* 16.7*  HCT 50.9* 51.0* 49.0*  MCV 94.6  --   --   PLT 386  --   --    Cardiac Enzymes: No results for input(s): CKTOTAL, CKMB, CKMBINDEX, TROPONINI in the last 168 hours. BNP (last 3 results) No results for input(s): PROBNP in the last 8760 hours. CBG: Recent Labs  Lab 05/11/20 2012 05/11/20 2111 05/11/20 2216 05/11/20 2310 05/12/20 0820  GLUCAP 218* 232* 208* 224* 219*   D-Dimer: Recent Labs    05/10/20 1822  DDIMER 2.26*   Hgb A1c: Recent Labs    05/11/20 0220  HGBA1C  14.5*   Lipid Profile: Recent Labs    05/10/20 1824  TRIG 280*   Thyroid function studies: No results for input(s): TSH, T4TOTAL, T3FREE, THYROIDAB in the last 72 hours.  Invalid input(s): FREET3 Anemia work up: Recent Labs    05/10/20 1822  FERRITIN 548*   Sepsis Labs: Recent Labs  Lab 05/10/20 1611 05/10/20 1645 05/10/20 1822 05/10/20 1945  PROCALCITON  --   --  0.37  --   WBC 16.9*  --   --   --   LATICACIDVEN  --  2.4*  --  4.9*   Microbiology Recent Results (from the past 240 hour(s))  SARS  Coronavirus 2 by RT PCR (hospital order, performed in [facility] hospital lab) Nasopharyngeal Nasopharyngeal Swab     Status: Abnormal   Collection Time: 05/10/20  4:04 PM   Specimen: Nasopharyngeal Swab  Result Value Ref Range Status   SARS Coronavirus 2 POSITIVE (A) NEGATIVE Final    Comment: RESULT CALLED TO, READ BACK BY AND VERIFIED WITH: Cherlyn Labella RN 1806 05/10/20 A BROWNING (NOTE) SARS-CoV-2 target nucleic acids are DETECTED  SARS-CoV-2 RNA is generally detectable in upper respiratory specimens  during the acute phase of infection.  Positive results are indicative  of the presence of the identified virus, but do not rule out bacterial infection or co-infection with other pathogens not detected by the test.  Clinical correlation with patient history and  other diagnostic information is necessary to determine patient infection status.  The expected result is negative.  Fact Sheet for Patients:   BoilerBrush.com.cy   Fact Sheet for Healthcare Providers:   https://pope.com/    This test is not yet approved or cleared by the [place] FDA and  has been authorized for detection and/or diagnosis of SARS-CoV-2 by FDA under an Emergency Use Authorization (EUA).  This EUA will remain in effect (meaning this  test can be used) for the duration of  the COVID-19 declaration under Section 564(b)(1) of the Act, 21 U.S.C. section 360-bbb-3(b)(1), unless the authorization is terminated or revoked sooner.  Performed at [facility], [address], [place], [place] [address]   Blood Culture (routine x 2)     Status: None (Preliminary result)   Collection Time: 05/10/20  6:24 PM   Specimen: BLOOD  Result Value Ref Range Status   Specimen Description BLOOD RIGHT ANTECUBITAL  Final   Special Requests   Final    BOTTLES DRAWN AEROBIC AND ANAEROBIC Blood Culture adequate volume   Culture   Final    NO GROWTH < 12 HOURS Performed at  [facility], [address] [place], [place] [address]    Report Status PENDING  Incomplete     Medications:   . Chlorhexidine Gluconate Cloth  6 each Topical Daily  . enoxaparin (LOVENOX) injection  40 mg Subcutaneous Q24H  . insulin aspart  0-5 Units Subcutaneous QHS  . insulin aspart  0-9 Units Subcutaneous TID WC  . insulin aspart  4 Units Subcutaneous TID WC  . insulin glargine  20 Units Subcutaneous QHS   Continuous Infusions: . sodium chloride    . remdesivir 100 mg in NS 100 mL        LOS: 1 day   Marinda Elk  [facility]  05/12/2020, 9:51 AM

## 2020-05-13 LAB — BASIC METABOLIC PANEL
Anion gap: 12 (ref 5–15)
BUN: 8 mg/dL (ref 6–20)
CO2: 20 mmol/L — ABNORMAL LOW (ref 22–32)
Calcium: 9 mg/dL (ref 8.9–10.3)
Chloride: 102 mmol/L (ref 98–111)
Creatinine, Ser: 0.6 mg/dL (ref 0.44–1.00)
GFR calc Af Amer: >60 mL/min (ref >60)
GFR calc non Af Amer: >60 mL/min (ref >60)
Glucose, Bld: 206 mg/dL — ABNORMAL HIGH (ref 70–99)
Potassium: 3.6 mmol/L (ref 3.5–5.1)
Sodium: 134 mmol/L — ABNORMAL LOW (ref 135–145)

## 2020-05-13 LAB — D-DIMER, QUANTITATIVE: D-Dimer, Quant: 0.93 ug/mL-FEU — ABNORMAL HIGH (ref 0.00–0.50)

## 2020-05-13 LAB — GLUCOSE, CAPILLARY
Glucose-Capillary: 240 mg/dL — ABNORMAL HIGH (ref 70–99)
Glucose-Capillary: 251 mg/dL — ABNORMAL HIGH (ref 70–99)
Glucose-Capillary: 206 mg/dL — ABNORMAL HIGH (ref 70–99)
Glucose-Capillary: 190 mg/dL — ABNORMAL HIGH (ref 70–99)

## 2020-05-13 LAB — C-REACTIVE PROTEIN: CRP: 1.5 mg/dL — ABNORMAL HIGH (ref <1.0)

## 2020-05-13 MED ORDER — SODIUM CHLORIDE 0.9 % IV SOLN
8.0000 mg | Freq: Four times a day (QID) | INTRAVENOUS | Status: DC | PRN
  Filled 2020-05-13: qty 4

## 2020-05-13 MED ORDER — PANTOPRAZOLE SODIUM 40 MG PO TBEC
40.0000 mg | DELAYED_RELEASE_TABLET | Freq: Two times a day (BID) | ORAL | Status: DC
  Administered 2020-05-13 – 2020-05-15 (×5): 40 mg via ORAL
  Filled 2020-05-13 (×5): qty 1

## 2020-05-13 MED ORDER — MAGNESIUM SULFATE 2 GM/50ML IV SOLN
2.0000 g | Freq: Once | INTRAVENOUS | Status: AC
  Administered 2020-05-13: 2 g via INTRAVENOUS
  Filled 2020-05-13: qty 50

## 2020-05-13 MED ORDER — INSULIN GLARGINE 100 UNIT/ML ~~LOC~~ SOLN
20.0000 [IU] | Freq: Two times a day (BID) | SUBCUTANEOUS | Status: DC
  Administered 2020-05-13 (×2): 20 [IU] via SUBCUTANEOUS
  Filled 2020-05-13 (×4): qty 0.2

## 2020-05-13 MED ORDER — METOCLOPRAMIDE HCL 5 MG/ML IJ SOLN
5.0000 mg | Freq: Three times a day (TID) | INTRAMUSCULAR | Status: DC
  Administered 2020-05-13 – 2020-05-14 (×4): 5 mg via INTRAVENOUS
  Filled 2020-05-13 (×4): qty 2

## 2020-05-13 MED ORDER — POTASSIUM CHLORIDE 20 MEQ PO PACK
40.0000 meq | PACK | Freq: Two times a day (BID) | ORAL | Status: AC
  Administered 2020-05-13 (×2): 40 meq via ORAL
  Filled 2020-05-13 (×2): qty 2

## 2020-05-13 NOTE — Progress Notes (Signed)
[facility] PROGRESS NOTE    Progress Note  [name]  NTI:[id number] DOB: Mar 21, 1986 DOA: 05/10/2020 PCP: Patient, No Pcp Per     Brief Narrative:   Taylor [name] is an 34 y.o. female past medical history of diabetes mellitus presents to the hospital with nausea vomiting comes in in DKA.  Assessment/Plan:   DKA (diabetic ketoacidoses) (HCC): With an A1c of 14 she was started on IV insulin once her gap closed and her bicarb became greater than 20 she was changed to long-acting insulin plus sliding scale. She is only tolerating about 25% of her meals.  She is currently on a full liquid diet.  Intractable Nausea and vomiting: We will start her on Reglan and Protonix orally twice a day. Increase her Zofran she relates she continues to be nauseated and dry heaving. Etiology unclear question due to COVID-19 versus gastroparesis. Start Protonix p.o. twice daily.  Pneumonia due to COVID-19 virus She was given Regeneron on 10 May 2020, she tested positive for COVID-19 on 05/10/2020. She still requiring 2 L of oxygen to keep her saturations greater than 90%. She was started on IV remdesivir and steroids, her inflammatory markers are pending this morning.  Hypokalemia: Likely due to nausea and vomiting give magnesium IV repeat potassium orally recheck basic metabolic panel in the morning. Basic metabolic panel is pending this morning.   DVT prophylaxis: lovenox Family Communication:none Status is: Inpatient  Remains inpatient appropriate because:Hemodynamically unstable   Dispo: The patient is from: Home              Anticipated d/c is to: Home              Anticipated d/c date is: 2 days              Patient currently is not medically stable to d/c. When she is able to tolerate a diet without vomiting.      Code Status:     Code Status Orders  (From admission, onward)         Start     Ordered   05/10/20 2358  Full code  Continuous        05/11/20 0003         Code Status History    Date Active Date Inactive Code Status Order ID Comments User Context   03/04/2016 1308 03/06/2016 1618 Full Code [id number]  Parrett, Virgel Bouquet, NP ED   Advance Care Planning Activity        IV Access:    Peripheral IV   Procedures and diagnostic studies:   No results found.   Medical Consultants:    None.  Anti-Infectives:   none  Subjective:    Taylor [name] she is she is still complaining of nausea and vomiting.  Objective:    Vitals:   05/12/20 1624 05/12/20 2005 05/12/20 2343 05/13/20 0405  BP: (!) 145/85 138/90 135/87 139/79  Pulse: 83 92 65 95  Resp: 18   16  Temp: 98.1 F (36.7 C) 98.1 F (36.7 C) 98.1 F (36.7 C) 97.9 F (36.6 C)  TempSrc: Oral Oral Oral Oral  SpO2: 100% 100% 90% (!) 84%  Weight:      Height:       SpO2: (!) 84 % O2 Flow Rate (L/min): 2 L/min   Intake/Output Summary (Last 24 hours) at 05/13/2020 0744 Last data filed at 05/12/2020 1300 Gross per 24 hour  Intake 400 ml  Output --  Net 400 ml   [name]  Weights   05/10/20 2046  Weight: 54.4 kg    Exam: General exam: In no acute distress. Respiratory system: Good air movement and clear to auscultation. Cardiovascular system: S1 & S2 heard, RRR. No JVD. Gastrointestinal system: Soft epigastric tenderness no rebound or guarding nondistended. Extremities: No pedal edema. Skin: No rashes, lesions or ulcers Psychiatry: Judgement and insight appear normal. Mood & affect appropriate.  Data Reviewed:    Labs: Basic Metabolic Panel: Recent Labs  Lab 05/10/20 2159 05/10/20 2159 05/10/20 2244 05/10/20 2244 05/11/20 0220 05/11/20 0220 05/11/20 0622 05/11/20 0622 05/11/20 1125 05/11/20 2054 05/12/20 0129  NA 136   < > 136  --  137  --  139  --  135  --  136  K 4.8   < > 4.5   < > 4.0   < > 3.8   < > 3.4*  --  3.3*  CL 102  --   --   --  111  --  110  --  105  --  101  CO2 <7*  --   --   --  12*  --  15*  --  17*  --  21*  GLUCOSE 598*  --   --    --  266*  --  240*  --  214*  --  271*  BUN 24*  --   --   --  17  --  13  --  11  --  6  CREATININE 1.40*  --   --   --  0.94  --  0.66  --  0.63  --  0.74  CALCIUM 9.0  --   --   --  7.4*  --  8.1*  --  7.9*  --  7.5*  MG  --   --   --   --  1.3*  --   --   --   --  2.0  --   PHOS  --   --   --   --  1.3*  --   --   --   --  2.2* 3.1   < > = values in this interval not displayed.   GFR Estimated Creatinine Clearance: 73.2 mL/min (by C-G formula based on SCr of 0.74 mg/dL). Liver Function Tests: Recent Labs  Lab 05/10/20 1611  AST 34  ALT 25  ALKPHOS 103  BILITOT 1.6*  PROT 8.6*  ALBUMIN 3.9   Recent Labs  Lab 05/10/20 1611  LIPASE 18   No results for input(s): AMMONIA in the last 168 hours. Coagulation profile No results for input(s): INR, PROTIME in the last 168 hours. COVID-19 Labs  Recent Labs    05/10/20 1822  DDIMER 2.26*  FERRITIN 548*  LDH 265*  CRP 2.5*    Lab Results  Component Value Date   SARSCOV2NAA POSITIVE (A) 05/10/2020    CBC: Recent Labs  Lab 05/10/20 1611 05/10/20 1834 05/10/20 2244  WBC 16.9*  --   --   HGB 15.5* 17.3* 16.7*  HCT 50.9* 51.0* 49.0*  MCV 94.6  --   --   PLT 386  --   --    Cardiac Enzymes: No results for input(s): CKTOTAL, CKMB, CKMBINDEX, TROPONINI in the last 168 hours. BNP (last 3 results) No results for input(s): PROBNP in the last 8760 hours. CBG: Recent Labs  Lab 05/11/20 2310 05/12/20 0820 05/12/20 1205 05/12/20 1622 05/12/20 2132  GLUCAP 224* 219* 180* 156* 180*   D-Dimer:  Recent Labs    05/10/20 1822  DDIMER 2.26*   Hgb A1c: Recent Labs    05/11/20 0220  HGBA1C 14.5*   Lipid Profile: Recent Labs    05/10/20 1824  TRIG 280*   Thyroid function studies: No results for input(s): TSH, T4TOTAL, T3FREE, THYROIDAB in the last 72 hours.  Invalid input(s): FREET3 Anemia work up: Recent Labs    05/10/20 1822  FERRITIN 548*   Sepsis Labs: Recent Labs  Lab 05/10/20 1611 05/10/20 1645  05/10/20 1822 05/10/20 1945  PROCALCITON  --   --  0.37  --   WBC 16.9*  --   --   --   LATICACIDVEN  --  2.4*  --  4.9*   Microbiology Recent Results (from the past 240 hour(s))  SARS Coronavirus 2 by RT PCR (hospital order, performed in [facility] hospital lab) Nasopharyngeal Nasopharyngeal Swab     Status: Abnormal   Collection Time: 05/10/20  4:04 PM   Specimen: Nasopharyngeal Swab  Result Value Ref Range Status   SARS Coronavirus 2 POSITIVE (A) NEGATIVE Final    Comment: RESULT CALLED TO, READ BACK BY AND VERIFIED WITH: Cherlyn Labella RN 1806 05/10/20 A BROWNING (NOTE) SARS-CoV-2 target nucleic acids are DETECTED  SARS-CoV-2 RNA is generally detectable in upper respiratory specimens  during the acute phase of infection.  Positive results are indicative  of the presence of the identified virus, but do not rule out bacterial infection or co-infection with other pathogens not detected by the test.  Clinical correlation with patient history and  other diagnostic information is necessary to determine patient infection status.  The expected result is negative.  Fact Sheet for Patients:   BoilerBrush.com.cy   Fact Sheet for Healthcare Providers:   https://pope.com/    This test is not yet approved or cleared by the [place] FDA and  has been authorized for detection and/or diagnosis of SARS-CoV-2 by FDA under an Emergency Use Authorization (EUA).  This EUA will remain in effect (meaning this  test can be used) for the duration of  the COVID-19 declaration under Section 564(b)(1) of the Act, 21 U.S.C. section 360-bbb-3(b)(1), unless the authorization is terminated or revoked sooner.  Performed at [facility], [address] [place], [place] [address]   Blood Culture (routine x 2)     Status: None (Preliminary result)   Collection Time: 05/10/20  6:24 PM   Specimen: BLOOD  Result Value Ref Range Status   Specimen  Description BLOOD RIGHT ANTECUBITAL  Final   Special Requests   Final    BOTTLES DRAWN AEROBIC AND ANAEROBIC Blood Culture adequate volume   Culture   Final    NO GROWTH 2 DAYS Performed at [facility], [address], [place], [place] [address]    Report Status PENDING  Incomplete  Blood Culture (routine x 2)     Status: None (Preliminary result)   Collection Time: 05/10/20  9:59 PM   Specimen: BLOOD RIGHT HAND  Result Value Ref Range Status   Specimen Description BLOOD RIGHT HAND  Final   Special Requests   Final    AEROBIC BOTTLE ONLY Blood Culture results may not be optimal due to an inadequate volume of blood received in culture bottles   Culture   Final    NO GROWTH 1 DAY Performed at [facility], [address] [place], [place] [address]    Report Status PENDING  Incomplete     Medications:   .  Chlorhexidine Gluconate Cloth  6 each Topical Daily  . enoxaparin (LOVENOX) injection  40 mg Subcutaneous Q24H  . insulin aspart  0-5 Units Subcutaneous QHS  . insulin aspart  0-9 Units Subcutaneous TID WC  . insulin aspart  4 Units Subcutaneous TID WC  . insulin glargine  20 Units Subcutaneous QHS  . methylPREDNISolone (SOLU-MEDROL) injection  80 mg Intravenous Q12H   Continuous Infusions: . sodium chloride    . remdesivir 100 mg in NS 100 mL 100 mg (05/12/20 1024)      LOS: 2 days   Marinda Elk  [facility]  05/13/2020, 7:44 AM

## 2020-05-13 NOTE — Progress Notes (Signed)
Inpatient Diabetes Program Recommendations  AACE/ADA: New Consensus Statement on Inpatient Glycemic Control (2015)  Target Ranges:  Prepandial:   less than 140 mg/dL      Peak postprandial:   less than 180 mg/dL (1-2 hours)      Critically ill patients:  140 - 180 mg/dL   Lab Results  Component Value Date   GLUCAP 251 (H) 05/13/2020   HGBA1C 14.5 (H) 05/11/2020    Review of Glycemic Control Results for Taylor [name], [name] (MRN [id number]) as of 05/13/2020 12:28  Ref. Range 05/13/2020 08:17 05/13/2020 12:25  Glucose-Capillary Latest Ref Range: 70 - 99 mg/dL 124 (H) 580 (H)   Diabetes history:  DM2 Outpatient Diabetes medications:  Lantus 20 units qhs Novolog 10 units tid with meals   Current orders for Inpatient glycemic control:  Lantus 20 units qhs Novolog 0-9 tid Novolog 0-5 qhs Novolog 4 units tid with meals Solumedrol 80 mg q12h   Inpatient Diabetes Program Recommendations:     Novolog 6 units tid with meals if east at least 50% of meal  Note:  Spoke with patient over the phone.  She states she ran out of Lantus and has not but administering ant basal insulin.  She has been administering Novolog 10 units tid with meals.  She recently moved from [place] and did not establish with a local PCP for refills.  She now has an appointment with the [facility] on October 6th.  Consulted TOC for a benefit check on cost of insulins as she has not used this new insurance for medications before.  She also states she will meed a meter at discharge.    She states she has occasional low blood sugars and is aware of signs, symptoms and treatment.    Will follow up with patient tomorrow and discuss insulin costs once benefit check is received.    Will continue to follow while inpatient.  Thank you, Dulce Sellar, RN, BSN Diabetes Coordinator Inpatient Diabetes Program [phone] (team pager from 8a-5p)

## 2020-05-14 LAB — BASIC METABOLIC PANEL
Anion gap: 12 (ref 5–15)
BUN: 11 mg/dL (ref 6–20)
CO2: 22 mmol/L (ref 22–32)
Calcium: 8.9 mg/dL (ref 8.9–10.3)
Chloride: 100 mmol/L (ref 98–111)
Creatinine, Ser: 0.56 mg/dL (ref 0.44–1.00)
GFR calc Af Amer: >60 mL/min (ref >60)
GFR calc non Af Amer: >60 mL/min (ref >60)
Glucose, Bld: 206 mg/dL — ABNORMAL HIGH (ref 70–99)
Potassium: 3.8 mmol/L (ref 3.5–5.1)
Sodium: 134 mmol/L — ABNORMAL LOW (ref 135–145)

## 2020-05-14 LAB — CBC
HCT: 40.1 % (ref 36.0–46.0)
Hemoglobin: 13.5 g/dL (ref 12.0–15.0)
MCH: 28.5 pg (ref 26.0–34.0)
MCHC: 33.7 g/dL (ref 30.0–36.0)
MCV: 84.8 fL (ref 80.0–100.0)
Platelets: 288 10*3/uL (ref 150–400)
RBC: 4.73 MIL/uL (ref 3.87–5.11)
RDW: 12.4 % (ref 11.5–15.5)
WBC: 11.3 10*3/uL — ABNORMAL HIGH (ref 4.0–10.5)
nRBC: 0 % (ref 0.0–0.2)

## 2020-05-14 LAB — D-DIMER, QUANTITATIVE: D-Dimer, Quant: 0.62 ug/mL-FEU — ABNORMAL HIGH (ref 0.00–0.50)

## 2020-05-14 LAB — GLUCOSE, CAPILLARY
Glucose-Capillary: 227 mg/dL — ABNORMAL HIGH (ref 70–99)
Glucose-Capillary: 193 mg/dL — ABNORMAL HIGH (ref 70–99)
Glucose-Capillary: 112 mg/dL — ABNORMAL HIGH (ref 70–99)
Glucose-Capillary: 88 mg/dL (ref 70–99)

## 2020-05-14 LAB — C-REACTIVE PROTEIN: CRP: 1 mg/dL — ABNORMAL HIGH (ref <1.0)

## 2020-05-14 MED ORDER — INSULIN GLARGINE 100 UNIT/ML ~~LOC~~ SOLN
25.0000 [IU] | Freq: Two times a day (BID) | SUBCUTANEOUS | Status: DC
  Administered 2020-05-14 – 2020-05-15 (×2): 25 [IU] via SUBCUTANEOUS
  Filled 2020-05-14 (×4): qty 0.25

## 2020-05-14 MED ORDER — METHYLPREDNISOLONE SODIUM SUCC 40 MG IJ SOLR
20.0000 mg | Freq: Two times a day (BID) | INTRAMUSCULAR | Status: DC
  Administered 2020-05-14 – 2020-05-15 (×2): 20 mg via INTRAVENOUS
  Filled 2020-05-14 (×2): qty 1

## 2020-05-14 MED ORDER — INSULIN ASPART 100 UNIT/ML ~~LOC~~ SOLN
8.0000 [IU] | Freq: Three times a day (TID) | SUBCUTANEOUS | Status: DC
  Administered 2020-05-14 – 2020-05-15 (×3): 8 [IU] via SUBCUTANEOUS

## 2020-05-14 NOTE — TOC Benefit Eligibility Note (Signed)
Transition of Care [facility]) Benefit Eligibility Note    Patient Details  Name: Taylor [name] MRN: [id number] Date of Birth: [birth year]   Medication/Dose: 1. levemir pen / 2. humalog pen  Covered?: Yes     Prescription Coverage Preferred Pharmacy: any retail pharmacy  Spoke with Person/Company/Phone Number:: [employer]  Co-Pay: 1. $25 for 30 day retail / 2. $25 for 30 day retail  Prior Approval: No     Additional Notes: Lantus and Novolog are not covered under the patients plan    Orson Aloe Phone Number: 05/14/2020, 2:12 PM

## 2020-05-14 NOTE — TOC Progression Note (Signed)
Transition of Care [facility]) - Progression Note    Patient Details  Name: Taylor [name] MRN: [id number] Date of Birth: 1986-03-12  [facility]) CM/SW Contact  Beckie Busing, RN Phone Number: [phone]  05/14/2020, 9:00 AM  Clinical Narrative:    Benefits check initiated. Results pending.        Expected Discharge Plan and Services                                                 Social Determinants of Health (SDOH) Interventions    Readmission Risk Interventions No flowsheet data found.

## 2020-05-14 NOTE — Progress Notes (Signed)
[facility] PROGRESS NOTE    Progress Note  Taylor [name]  GNF:[id number] DOB: September 21, 1985 DOA: 05/10/2020 PCP: Patient, No Pcp Per     Brief Narrative:   Taylor [name] is an 34 y.o. female past medical history of diabetes mellitus presents to the hospital with nausea vomiting comes in in DKA.  Assessment/Plan:   DKA (diabetic ketoacidoses) (HCC): With an A1c of 14 she was started on IV insulin once her gap closed and her bicarb became greater than 20 she was changed to long-acting insulin plus sliding scale. She is on a regular diet only continues to eat about 25% of her meals.  Intractable Nausea and vomiting: We will start her on Reglan and Protonix orally twice a day. Her nausea and dry heaving have improved.  Continue Protonix and Zofran will discontinue Reglan.  Pneumonia due to COVID-19 virus She was given Regeneron on 10 May 2020, she tested positive for COVID-19 on 05/10/2020. Just this morning she has been weaned to room air satting greater 90%. Continue IV remdesivir and steroids for complete her course of remdesivir tomorrow. We will start tapering down steroids.  Hypokalemia: Likely due to nausea and vomiting give magnesium IV repeat potassium orally recheck basic metabolic panel in the morning. Basic metabolic panel is pending this morning.   DVT prophylaxis: lovenox Family Communication:none Status is: Inpatient  Remains inpatient appropriate because:Hemodynamically unstable   Dispo: The patient is from: Home              Anticipated d/c is to: Home              Anticipated d/c date is: 2 days              Patient currently is not medically stable to d/c. When she is able to tolerate a diet without vomiting.      Code Status:     Code Status Orders  (From admission, onward)         Start     Ordered   05/10/20 2358  Full code  Continuous        05/11/20 0003        Code Status History    Date Active Date Inactive Code Status Order ID  Comments User Context   03/04/2016 1308 03/06/2016 1618 Full Code [id number]  Parrett, Virgel Bouquet, NP ED   Advance Care Planning Activity        IV Access:    Peripheral IV   Procedures and diagnostic studies:   No results found.   Medical Consultants:    None.  Anti-Infectives:   none  Subjective:    Taylor [name] relates her dry heaving has improved.  Objective:    Vitals:   05/13/20 2000 05/13/20 2327 05/14/20 0300 05/14/20 0814  BP: (!) 156/87 (!) 138/93 (!) 150/90 (!) 159/99  Pulse: 73 78 78 (!) 35  Resp: 20 16 20 20   Temp: 99 F (37.2 C) 98.9 F (37.2 C) 98 F (36.7 C) 97.8 F (36.6 C)  TempSrc: Oral Oral Oral Oral  SpO2: 100% 100% 100% 96%  Weight:      Height:       SpO2: 96 % O2 Flow Rate (L/min): 2 L/min  No intake or output data in the 24 hours ending 05/14/20 1030 Filed Weights   05/10/20 2046  Weight: 54.4 kg    Exam: General exam: In no acute distress. Respiratory system: Good air movement and clear to auscultation. Cardiovascular system: S1 & S2 heard,  RRR. No JVD. Gastrointestinal system: Abdomen is nondistended, soft and nontender.  Extremities: No pedal edema. Skin: No rashes, lesions or ulcers  Data Reviewed:    Labs: Basic Metabolic Panel: Recent Labs  Lab 05/11/20 0220 05/11/20 0220 05/11/20 0622 05/11/20 0622 05/11/20 1125 05/11/20 1125 05/11/20 2054 05/12/20 0129 05/12/20 0129 05/13/20 1530 05/14/20 0300  NA 137   < > 139  --  135  --   --  136  --  134* 134*  K 4.0   < > 3.8   < > 3.4*   < >  --  3.3*   < > 3.6 3.8  CL 111   < > 110  --  105  --   --  101  --  102 100  CO2 12*   < > 15*  --  17*  --   --  21*  --  20* 22  GLUCOSE 266*   < > 240*  --  214*  --   --  271*  --  206* 206*  BUN 17   < > 13  --  11  --   --  6  --  8 11  CREATININE 0.94   < > 0.66  --  0.63  --   --  0.74  --  0.60 0.56  CALCIUM 7.4*   < > 8.1*  --  7.9*  --   --  7.5*  --  9.0 8.9  MG 1.3*  --   --   --   --   --  2.0  --   --   --    --   PHOS 1.3*  --   --   --   --   --  2.2* 3.1  --   --   --    < > = values in this interval not displayed.   GFR Estimated Creatinine Clearance: 73.2 mL/min (by C-G formula based on SCr of 0.56 mg/dL). Liver Function Tests: Recent Labs  Lab 05/10/20 1611  AST 34  ALT 25  ALKPHOS 103  BILITOT 1.6*  PROT 8.6*  ALBUMIN 3.9   Recent Labs  Lab 05/10/20 1611  LIPASE 18   No results for input(s): AMMONIA in the last 168 hours. Coagulation profile No results for input(s): INR, PROTIME in the last 168 hours. COVID-19 Labs  Recent Labs    05/13/20 1530 05/14/20 0300  DDIMER 0.93* 0.62*  CRP 1.5* 1.0*    Lab Results  Component Value Date   SARSCOV2NAA POSITIVE (A) 05/10/2020    CBC: Recent Labs  Lab 05/10/20 1611 05/10/20 1834 05/10/20 2244 05/14/20 0300  WBC 16.9*  --   --  11.3*  HGB 15.5* 17.3* 16.7* 13.5  HCT 50.9* 51.0* 49.0* 40.1  MCV 94.6  --   --  84.8  PLT 386  --   --  288   Cardiac Enzymes: No results for input(s): CKTOTAL, CKMB, CKMBINDEX, TROPONINI in the last 168 hours. BNP (last 3 results) No results for input(s): PROBNP in the last 8760 hours. CBG: Recent Labs  Lab 05/13/20 0817 05/13/20 1225 05/13/20 1726 05/13/20 2001 05/14/20 0818  GLUCAP 240* 251* 206* 190* 227*   D-Dimer: Recent Labs    05/13/20 1530 05/14/20 0300  DDIMER 0.93* 0.62*   Hgb A1c: No results for input(s): HGBA1C in the last 72 hours. Lipid Profile: No results for input(s): CHOL, HDL, LDLCALC, TRIG, CHOLHDL, LDLDIRECT in the last 72 hours. Thyroid function  studies: No results for input(s): TSH, T4TOTAL, T3FREE, THYROIDAB in the last 72 hours.  Invalid input(s): FREET3 Anemia work up: No results for input(s): VITAMINB12, FOLATE, FERRITIN, TIBC, IRON, RETICCTPCT in the last 72 hours. Sepsis Labs: Recent Labs  Lab 05/10/20 1611 05/10/20 1645 05/10/20 1822 05/10/20 1945 05/14/20 0300  PROCALCITON  --   --  0.37  --   --   WBC 16.9*  --   --   --   11.3*  LATICACIDVEN  --  2.4*  --  4.9*  --    Microbiology Recent Results (from the past 240 hour(s))  SARS Coronavirus 2 by RT PCR (hospital order, performed in [facility] hospital lab) Nasopharyngeal Nasopharyngeal Swab     Status: Abnormal   Collection Time: 05/10/20  4:04 PM   Specimen: Nasopharyngeal Swab  Result Value Ref Range Status   SARS Coronavirus 2 POSITIVE (A) NEGATIVE Final    Comment: RESULT CALLED TO, READ BACK BY AND VERIFIED WITH: Cherlyn Labella RN 1806 05/10/20 A BROWNING (NOTE) SARS-CoV-2 target nucleic acids are DETECTED  SARS-CoV-2 RNA is generally detectable in upper respiratory specimens  during the acute phase of infection.  Positive results are indicative  of the presence of the identified virus, but do not rule out bacterial infection or co-infection with other pathogens not detected by the test.  Clinical correlation with patient history and  other diagnostic information is necessary to determine patient infection status.  The expected result is negative.  Fact Sheet for Patients:   BoilerBrush.com.cy   Fact Sheet for Healthcare Providers:   https://pope.com/    This test is not yet approved or cleared by the [place] FDA and  has been authorized for detection and/or diagnosis of SARS-CoV-2 by FDA under an Emergency Use Authorization (EUA).  This EUA will remain in effect (meaning this  test can be used) for the duration of  the COVID-19 declaration under Section 564(b)(1) of the Act, 21 U.S.C. section 360-bbb-3(b)(1), unless the authorization is terminated or revoked sooner.  Performed at [facility], [address], [place], [place] [address]   Blood Culture (routine x 2)     Status: None (Preliminary result)   Collection Time: 05/10/20  6:24 PM   Specimen: BLOOD  Result Value Ref Range Status   Specimen Description BLOOD RIGHT ANTECUBITAL  Final   Special Requests   Final    BOTTLES  DRAWN AEROBIC AND ANAEROBIC Blood Culture adequate volume   Culture   Final    NO GROWTH 3 DAYS Performed at [facility], [address] [place], [place] [address]    Report Status PENDING  Incomplete  Blood Culture (routine x 2)     Status: None (Preliminary result)   Collection Time: 05/10/20  9:59 PM   Specimen: BLOOD RIGHT HAND  Result Value Ref Range Status   Specimen Description BLOOD RIGHT HAND  Final   Special Requests   Final    AEROBIC BOTTLE ONLY Blood Culture results may not be optimal due to an inadequate volume of blood received in culture bottles   Culture   Final    NO GROWTH 2 DAYS Performed at [facility], [address], [place], [place] [address]    Report Status PENDING  Incomplete     Medications:   . Chlorhexidine Gluconate Cloth  6 each Topical Daily  . enoxaparin (LOVENOX) injection  40 mg Subcutaneous Q24H  . insulin aspart  0-5 Units Subcutaneous QHS  .  insulin aspart  0-9 Units Subcutaneous TID WC  . insulin aspart  4 Units Subcutaneous TID WC  . insulin glargine  25 Units Subcutaneous BID  . methylPREDNISolone (SOLU-MEDROL) injection  80 mg Intravenous Q12H  . metoCLOPramide (REGLAN) injection  5 mg Intravenous Q8H  . pantoprazole  40 mg Oral BID   Continuous Infusions: . sodium chloride    . ondansetron (ZOFRAN) IV    . remdesivir 100 mg in NS 100 mL 100 mg (05/14/20 0932)      LOS: 3 days   Marinda ElkAbraham Feliz Ortiz  [facility]  05/14/2020, 10:30 AM

## 2020-05-15 DIAGNOSIS — J9601 Acute respiratory failure with hypoxia: Secondary | ICD-10-CM

## 2020-05-15 LAB — CULTURE, BLOOD (ROUTINE X 2)
Culture: NO GROWTH
Special Requests: ADEQUATE

## 2020-05-15 LAB — BASIC METABOLIC PANEL
Anion gap: 15 (ref 5–15)
BUN: 15 mg/dL (ref 6–20)
CO2: 22 mmol/L (ref 22–32)
Calcium: 8.7 mg/dL — ABNORMAL LOW (ref 8.9–10.3)
Chloride: 97 mmol/L — ABNORMAL LOW (ref 98–111)
Creatinine, Ser: 0.47 mg/dL (ref 0.44–1.00)
GFR calc Af Amer: >60 mL/min (ref >60)
GFR calc non Af Amer: >60 mL/min (ref >60)
Glucose, Bld: 84 mg/dL (ref 70–99)
Potassium: 3.7 mmol/L (ref 3.5–5.1)
Sodium: 134 mmol/L — ABNORMAL LOW (ref 135–145)

## 2020-05-15 LAB — D-DIMER, QUANTITATIVE: D-Dimer, Quant: 0.69 ug/mL-FEU — ABNORMAL HIGH (ref 0.00–0.50)

## 2020-05-15 LAB — CBC
HCT: 42.4 % (ref 36.0–46.0)
Hemoglobin: 14.6 g/dL (ref 12.0–15.0)
MCH: 29 pg (ref 26.0–34.0)
MCHC: 34.4 g/dL (ref 30.0–36.0)
MCV: 84.1 fL (ref 80.0–100.0)
Platelets: 255 10*3/uL (ref 150–400)
RBC: 5.04 MIL/uL (ref 3.87–5.11)
RDW: 12.4 % (ref 11.5–15.5)
WBC: 14.8 10*3/uL — ABNORMAL HIGH (ref 4.0–10.5)
nRBC: 0 % (ref 0.0–0.2)

## 2020-05-15 LAB — C-REACTIVE PROTEIN: CRP: 0.5 mg/dL (ref <1.0)

## 2020-05-15 LAB — GLUCOSE, CAPILLARY: Glucose-Capillary: 95 mg/dL (ref 70–99)

## 2020-05-15 MED ORDER — INSULIN PEN NEEDLE 31G X 6 MM MISC: 1.0000 | Freq: Two times a day (BID) | 3 refills | Status: AC

## 2020-05-15 MED ORDER — INSULIN LISPRO 200 UNIT/ML ~~LOC~~ SOPN: 10.0000 [IU] | PEN_INJECTOR | Freq: Three times a day (TID) | SUBCUTANEOUS | 0 refills | Status: DC

## 2020-05-15 MED ORDER — LANTUS SOLOSTAR 100 UNIT/ML ~~LOC~~ SOPN: 30.0000 [IU] | PEN_INJECTOR | Freq: Every day | SUBCUTANEOUS | 11 refills | Status: DC

## 2020-05-15 MED ORDER — INSULIN DETEMIR 100 UNIT/ML FLEXPEN: 20.0000 [IU] | PEN_INJECTOR | Freq: Every day | SUBCUTANEOUS | 11 refills | Status: DC

## 2020-05-15 NOTE — Progress Notes (Addendum)
Inpatient Diabetes Program Recommendations  AACE/ADA: New Consensus Statement on Inpatient Glycemic Control   Target Ranges:  Prepandial:   less than 140 mg/dL      Peak postprandial:   less than 180 mg/dL (1-2 hours)      Critically ill patients:  140 - 180 mg/dL   Results for Taylor [name], Taylor [name] (MRN [id number]) as of 05/15/2020 07:44  Ref. Range 05/14/2020 08:18 05/14/2020 12:11 05/14/2020 16:20 05/14/2020 21:11 05/15/2020 07:42  Glucose-Capillary Latest Ref Range: 70 - 99 mg/dL 227 (H) 193 (H) 112 (H) 88 95   Review of Glycemic Control  Diabetes history: DM2 Outpatient Diabetes medications: Lantus 20 QHS, Novolog 10 units TID with meals Current orders for Inpatient glycemic control: Lantus 25 units BID, Novolog 0-9 units TID with meal, Novolog 0-5 units QHS, Novolog 8 units TID with meals; Solumedrol 20 mg Q12H  Inpatient Diabetes Program Recommendations:    Insulin: Noted steroids decreased and Lantus was NOT GIVEN last night. Fasting glucose 95 mg/dl this morning. Please consider decreasing Lantus to 20 units daily and meal coverage to Novolog 5 units TID with meals.  Outpatient: Per TOC note on 05/14/20, patient will need to be prescribed Levemir Flextouch [phone]), Humalog Kwikpen [phone]), insulin pen needles (#[id number]), and glucose monitoring kit (#[id number]) at time of discharge.  NOTE: Noted TOC note on 05/14/20 indicating that Levemir and Humalog are preferred insulins with current insurance as Lantus and Novolog are not covered and copays will be $25 for each insulin.  Patient had reported to diabetes coordinator on 05/13/20 that she had ran out of Lantus and had some Novolog left which she was using to try to keep glucose controlled. She stated that she had not gotten Lantus or Novolog insulins filled since moving from [place] and she had an appointment at [facility] on October 6th to establish care. Spoke with the patient over the phone regarding insulins that are covered.   Explained  that per TOC note, Levemir and Humalog are the preferred insulins that her insurance covers and the copay is $25 per insulin. Patient states that cost is affordable for her. Explained that I would ask MD to provide Rx for Levemir pens, Humalog pens, pen needles, and glucose monitoring kit. Patient verbalized understanding of information discussed and appreciative of information and states that she has no other needs regarding her DM self management.   Thanks, Barnie Alderman, RN, MSN, CDE Diabetes Coordinator Inpatient Diabetes Program [phone] (Team Pager from 8am to 5pm)

## 2020-05-15 NOTE — Discharge Summary (Addendum)
Physician Discharge Summary  Taylor [name] ZMO:[id number] DOB: 1985-09-22 DOA: 05/10/2020  PCP: Patient, No Pcp Per  Admit date: 05/10/2020 Discharge date: 05/15/2020  Admitted From: Home Disposition:  Home  Recommendations for Outpatient Follow-up:  1. Follow up with PCP in 1-2 weeks 2. Please obtain BMP/CBC in one week   Home Health:No Equipment/Devices:None  Discharge Condition:Stable CODE STATUS:Full Diet recommendation: Carb Modified  Brief/Interim Summary: 34 y.o. female past medical history of diabetes mellitus presents to the hospital with nausea vomiting comes in in DKA  Discharge Diagnoses:  Active Problems:   DKA (diabetic ketoacidoses) ([place])   Pneumonia due to COVID-19 virus  DKA and a type I diabetic: With a last hemoglobin A1c of 14 she was started on IV insulin IV fluids, her gap closed her bicarb returned to greater than 20. She was changed to long-acting insulin plus sliding scale.  She was discharged in stable condition.  Intractable nausea vomiting: She was started on IV Protonix twice and IV Reglan 3 times a day a day as she was continually dry heaving.  After 2 days her nausea vomiting resolved. She will follow up with her PCP as an outpatient.  Acute respiratory failure with hypoxia due to pneumonia in the setting of COVID-19: She was given Regeneron on May 11, 2019 when she tested positive on that same date. In the ED she was found to be hypoxic she was started on oxygen supplementation IV steroids and IV remdesivir and she was weaned to room air she complete her course of IV remdesivir and steroids in house she was discharged in stable condition.  Hypokalemia: Likely due to nausea and vomiting it was repleted orally as well her magnesium.  Discharge Instructions  Discharge Instructions    Diet - low sodium heart healthy   Complete by: As directed    Increase activity slowly   Complete by: As directed      Allergies as of 05/15/2020   No Known  Allergies     Medication List    STOP taking these medications   insulin aspart 100 UNIT/ML FlexPen Commonly known as: NovoLOG FlexPen   insulin glargine 100 UNIT/ML Solostar Pen Commonly known as: Lantus SoloStar     TAKE these medications   blood glucose meter kit and supplies Kit Dispense based on patient and insurance preference. Use up to four times daily as directed. (FOR ICD-9 250.00, 250.01).  Diagnosis: Insulin dependent Diabetes mellitus. ICD 10 code E11.9   insulin detemir 100 UNIT/ML FlexPen Commonly known as: LEVEMIR Inject 20 Units into the skin daily.   insulin lispro 200 UNIT/ML KwikPen Commonly known as: HUMALOG Inject 10 Units into the skin 3 (three) times daily before meals.   Insulin Pen Needle 31G X 6 MM Misc 1 Device by Does not apply route 2 (two) times daily. What changed:   medication strength  how much to take  how to take this  when to take this  additional instructions       No Known Allergies  Consultations:  None   Procedures/Studies: DG Chest Portable 1 View  Result Date: 05/10/2020 CLINICAL DATA:  COVID-19 positive, shortness of breath, weakness EXAM: PORTABLE CHEST 1 VIEW COMPARISON:  03/04/2016 FINDINGS: The heart size and mediastinal contours are within normal limits. Both lungs are clear. The visualized skeletal structures are unremarkable. IMPRESSION: No active disease. Electronically Signed   By: Randa Ngo M.D.   On: 05/10/2020 21:11    Subjective: No new complaints.  Discharge Exam: Vitals:  05/15/20 0732 05/15/20 0733  BP:    Pulse: 82 87  Resp: 13 13  Temp:    SpO2: 100% 100%   Vitals:   05/14/20 2110 05/14/20 2310 05/15/20 0732 05/15/20 0733  BP: 122/83 125/81    Pulse: 89 (!) 102 82 87  Resp: '16 16 13 13  ' Temp: 99.3 F (37.4 C) 98.9 F (37.2 C)    TempSrc:  Oral    SpO2: 100% 100% 100% 100%  Weight:      Height:        General: Pt is alert, awake, not in acute distress Cardiovascular:  RRR, S1/S2 +, no rubs, no gallops Respiratory: CTA bilaterally, no wheezing, no rhonchi Abdominal: Soft, NT, ND, bowel sounds + Extremities: no edema, no cyanosis    The results of significant diagnostics from this hospitalization (including imaging, microbiology, ancillary and laboratory) are listed below for reference.     Microbiology: Recent Results (from the past 240 hour(s))  SARS Coronavirus 2 by RT PCR (hospital order, performed in [facility] hospital lab) Nasopharyngeal Nasopharyngeal Swab     Status: Abnormal   Collection Time: 05/10/20  4:04 PM   Specimen: Nasopharyngeal Swab  Result Value Ref Range Status   SARS Coronavirus 2 POSITIVE (A) NEGATIVE Final    Comment: RESULT CALLED TO, READ BACK BY AND VERIFIED WITH: Gareth Eagle RN 1806 05/10/20 A BROWNING (NOTE) SARS-CoV-2 target nucleic acids are DETECTED  SARS-CoV-2 RNA is generally detectable in upper respiratory specimens  during the acute phase of infection.  Positive results are indicative  of the presence of the identified virus, but do not rule out bacterial infection or co-infection with other pathogens not detected by the test.  Clinical correlation with patient history and  other diagnostic information is necessary to determine patient infection status.  The expected result is negative.  Fact Sheet for Patients:   StrictlyIdeas.no   Fact Sheet for Healthcare Providers:   BankingDealers.co.za    This test is not yet approved or cleared by the [place] FDA and  has been authorized for detection and/or diagnosis of SARS-CoV-2 by FDA under an Emergency Use Authorization (EUA).  This EUA will remain in effect (meaning this  test can be used) for the duration of  the COVID-19 declaration under Section 564(b)(1) of the Act, 21 U.S.C. section 360-bbb-3(b)(1), unless the authorization is terminated or revoked sooner.  Performed at [facility], [place]  [address] [place], [place] [address]   Blood Culture (routine x 2)     Status: None   Collection Time: 05/10/20  6:24 PM   Specimen: BLOOD  Result Value Ref Range Status   Specimen Description BLOOD RIGHT ANTECUBITAL  Final   Special Requests   Final    BOTTLES DRAWN AEROBIC AND ANAEROBIC Blood Culture adequate volume   Culture   Final    NO GROWTH 5 DAYS Performed at [facility], [place] [address], [place], [place] [address]    Report Status 05/15/2020 FINAL  Final  Blood Culture (routine x 2)     Status: None (Preliminary result)   Collection Time: 05/10/20  9:59 PM   Specimen: BLOOD RIGHT HAND  Result Value Ref Range Status   Specimen Description BLOOD RIGHT HAND  Final   Special Requests   Final    AEROBIC BOTTLE ONLY Blood Culture results may not be optimal due to an inadequate volume of blood received in culture bottles   Culture   Final  NO GROWTH 4 DAYS Performed at [facility], [place] [address], [place], [place] [address]    Report Status PENDING  Incomplete     Labs: BNP (last 3 results) No results for input(s): BNP in the last 8760 hours. Basic Metabolic Panel: Recent Labs  Lab 05/11/20 0220 05/11/20 0622 05/11/20 1125 05/11/20 2054 05/12/20 0129 05/13/20 1530 05/14/20 0300 05/15/20 0246  NA 137   < > 135  --  136 134* 134* 134*  K 4.0   < > 3.4*  --  3.3* 3.6 3.8 3.7  CL 111   < > 105  --  101 102 100 97*  CO2 12*   < > 17*  --  21* 20* 22 22  GLUCOSE 266*   < > 214*  --  271* 206* 206* 84  BUN 17   < > 11  --  '6 8 11 15  ' CREATININE 0.94   < > 0.63  --  0.74 0.60 0.56 0.47  CALCIUM 7.4*   < > 7.9*  --  7.5* 9.0 8.9 8.7*  MG 1.3*  --   --  2.0  --   --   --   --   PHOS 1.3*  --   --  2.2* 3.1  --   --   --    < > = values in this interval not displayed.   Liver Function Tests: Recent Labs  Lab 05/10/20 1611  AST 34  ALT 25  ALKPHOS 103  BILITOT 1.6*  PROT 8.6*  ALBUMIN 3.9   Recent Labs  Lab 05/10/20 1611  LIPASE 18   No results  for input(s): AMMONIA in the last 168 hours. CBC: Recent Labs  Lab 05/10/20 1611 05/10/20 1834 05/10/20 2244 05/14/20 0300 05/15/20 0246  WBC 16.9*  --   --  11.3* 14.8*  HGB 15.5* 17.3* 16.7* 13.5 14.6  HCT 50.9* 51.0* 49.0* 40.1 42.4  MCV 94.6  --   --  84.8 84.1  PLT 386  --   --  288 255   Cardiac Enzymes: No results for input(s): CKTOTAL, CKMB, CKMBINDEX, TROPONINI in the last 168 hours. BNP: Invalid input(s): POCBNP CBG: Recent Labs  Lab 05/14/20 0818 05/14/20 1211 05/14/20 1620 05/14/20 2111 05/15/20 0742  GLUCAP 227* 193* 112* 88 95   D-Dimer Recent Labs    05/14/20 0300 05/15/20 0246  DDIMER 0.62* 0.69*   Hgb A1c No results for input(s): HGBA1C in the last 72 hours. Lipid Profile No results for input(s): CHOL, HDL, LDLCALC, TRIG, CHOLHDL, LDLDIRECT in the last 72 hours. Thyroid function studies No results for input(s): TSH, T4TOTAL, T3FREE, THYROIDAB in the last 72 hours.  Invalid input(s): FREET3 Anemia work up No results for input(s): VITAMINB12, FOLATE, FERRITIN, TIBC, IRON, RETICCTPCT in the last 72 hours. Urinalysis    Component Value Date/Time   COLORURINE YELLOW 03/04/2016 [place] 03/04/2016 0754   LABSPEC 1.033 (H) 03/04/2016 0754   PHURINE 5.0 03/04/2016 0754   GLUCOSEU >1000 (A) 03/04/2016 0754   HGBUR TRACE (A) 03/04/2016 0754   HGBUR negative 09/24/2009 0837   BILIRUBINUR NEGATIVE 03/04/2016 0754   KETONESUR >80 (A) 03/04/2016 0754   PROTEINUR 100 (A) 03/04/2016 0754   UROBILINOGEN 0.2 04/09/2011 1725   NITRITE NEGATIVE 03/04/2016 0754   LEUKOCYTESUR NEGATIVE 03/04/2016 0754   Sepsis Labs Invalid input(s): PROCALCITONIN,  WBC,  LACTICIDVEN Microbiology Recent Results (from the past 240 hour(s))  SARS Coronavirus 2 by RT PCR (hospital  order, performed in [facility] hospital lab) Nasopharyngeal Nasopharyngeal Swab     Status: Abnormal   Collection Time: 05/10/20  4:04 PM   Specimen: Nasopharyngeal Swab  Result  Value Ref Range Status   SARS Coronavirus 2 POSITIVE (A) NEGATIVE Final    Comment: RESULT CALLED TO, READ BACK BY AND VERIFIED WITH: Gareth Eagle RN 1806 05/10/20 A BROWNING (NOTE) SARS-CoV-2 target nucleic acids are DETECTED  SARS-CoV-2 RNA is generally detectable in upper respiratory specimens  during the acute phase of infection.  Positive results are indicative  of the presence of the identified virus, but do not rule out bacterial infection or co-infection with other pathogens not detected by the test.  Clinical correlation with patient history and  other diagnostic information is necessary to determine patient infection status.  The expected result is negative.  Fact Sheet for Patients:   StrictlyIdeas.no   Fact Sheet for Healthcare Providers:   BankingDealers.co.za    This test is not yet approved or cleared by the [place] FDA and  has been authorized for detection and/or diagnosis of SARS-CoV-2 by FDA under an Emergency Use Authorization (EUA).  This EUA will remain in effect (meaning this  test can be used) for the duration of  the COVID-19 declaration under Section 564(b)(1) of the Act, 21 U.S.C. section 360-bbb-3(b)(1), unless the authorization is terminated or revoked sooner.  Performed at [facility], [place] [address] [place], [place] [address]   Blood Culture (routine x 2)     Status: None   Collection Time: 05/10/20  6:24 PM   Specimen: BLOOD  Result Value Ref Range Status   Specimen Description BLOOD RIGHT ANTECUBITAL  Final   Special Requests   Final    BOTTLES DRAWN AEROBIC AND ANAEROBIC Blood Culture adequate volume   Culture   Final    NO GROWTH 5 DAYS Performed at [facility], [place] [address], [place], [place] [address]    Report Status 05/15/2020 FINAL  Final  Blood Culture (routine x 2)     Status: None (Preliminary result)   Collection Time: 05/10/20  9:59 PM   Specimen: BLOOD RIGHT HAND   Result Value Ref Range Status   Specimen Description BLOOD RIGHT HAND  Final   Special Requests   Final    AEROBIC BOTTLE ONLY Blood Culture results may not be optimal due to an inadequate volume of blood received in culture bottles   Culture   Final    NO GROWTH 4 DAYS Performed at [facility], [place] [address], [place], [place] [address]    Report Status PENDING  Incomplete     Time coordinating discharge: Over 40 minutes  SIGNED:   Charlynne Cousins, MD  [facility] 05/15/2020, 7:58 AM Pager   If 7PM-7AM, please contact night-coverage [email or web address] Password [email or web address]

## 2020-05-16 LAB — CULTURE, BLOOD (ROUTINE X 2): Culture: NO GROWTH

## 2020-05-27 ENCOUNTER — Emergency Department (HOSPITAL_COMMUNITY): Payer: Managed Care, Other (non HMO)

## 2020-05-27 ENCOUNTER — Inpatient Hospital Stay (HOSPITAL_COMMUNITY): Payer: Managed Care, Other (non HMO)

## 2020-05-27 ENCOUNTER — Other Ambulatory Visit: Payer: Self-pay

## 2020-05-27 ENCOUNTER — Encounter (HOSPITAL_COMMUNITY): Payer: Self-pay | Admitting: Emergency Medicine

## 2020-05-27 ENCOUNTER — Inpatient Hospital Stay (HOSPITAL_COMMUNITY)
Admission: EM | Admit: 2020-05-27 | Discharge: 2020-05-29 | DRG: 074 | Disposition: A | Discharge status: Home / Self Care | Payer: Managed Care, Other (non HMO) | Attending: Internal Medicine | Admitting: Internal Medicine

## 2020-05-27 DIAGNOSIS — A419 Sepsis, unspecified organism: Secondary | ICD-10-CM | POA: Diagnosis not present

## 2020-05-27 DIAGNOSIS — R112 Nausea with vomiting, unspecified: Secondary | ICD-10-CM

## 2020-05-27 DIAGNOSIS — R000 Tachycardia, unspecified: Secondary | ICD-10-CM

## 2020-05-27 DIAGNOSIS — E8729 Other acidosis: Secondary | ICD-10-CM

## 2020-05-27 DIAGNOSIS — E876 Hypokalemia: Secondary | ICD-10-CM | POA: Diagnosis present

## 2020-05-27 DIAGNOSIS — E1143 Type 2 diabetes mellitus with diabetic autonomic (poly)neuropathy: Principal | ICD-10-CM | POA: Diagnosis present

## 2020-05-27 DIAGNOSIS — E860 Dehydration: Secondary | ICD-10-CM | POA: Diagnosis present

## 2020-05-27 DIAGNOSIS — K3184 Gastroparesis: Secondary | ICD-10-CM | POA: Diagnosis present

## 2020-05-27 DIAGNOSIS — N179 Acute kidney failure, unspecified: Secondary | ICD-10-CM | POA: Diagnosis present

## 2020-05-27 DIAGNOSIS — R8281 Pyuria: Secondary | ICD-10-CM | POA: Diagnosis present

## 2020-05-27 DIAGNOSIS — D751 Secondary polycythemia: Secondary | ICD-10-CM | POA: Diagnosis present

## 2020-05-27 DIAGNOSIS — R6520 Severe sepsis without septic shock: Secondary | ICD-10-CM | POA: Diagnosis not present

## 2020-05-27 DIAGNOSIS — E1010 Type 1 diabetes mellitus with ketoacidosis without coma: Secondary | ICD-10-CM

## 2020-05-27 DIAGNOSIS — Z8616 Personal history of COVID-19: Secondary | ICD-10-CM | POA: Diagnosis not present

## 2020-05-27 DIAGNOSIS — Z794 Long term (current) use of insulin: Secondary | ICD-10-CM

## 2020-05-27 DIAGNOSIS — R6510 Systemic inflammatory response syndrome (SIRS) of non-infectious origin without acute organ dysfunction: Secondary | ICD-10-CM | POA: Diagnosis present

## 2020-05-27 DIAGNOSIS — Z8632 Personal history of gestational diabetes: Secondary | ICD-10-CM | POA: Diagnosis not present

## 2020-05-27 DIAGNOSIS — K5900 Constipation, unspecified: Secondary | ICD-10-CM | POA: Diagnosis present

## 2020-05-27 DIAGNOSIS — U071 COVID-19: Secondary | ICD-10-CM

## 2020-05-27 LAB — URINALYSIS, ROUTINE W REFLEX MICROSCOPIC
Glucose, UA: 50 mg/dL — AB
Ketones, ur: 20 mg/dL — AB
Nitrite: NEGATIVE
Protein, ur: 100 mg/dL — AB
Specific Gravity, Urine: 1.021 (ref 1.005–1.030)
pH: 5 (ref 5.0–8.0)

## 2020-05-27 LAB — BASIC METABOLIC PANEL
Anion gap: 10 (ref 5–15)
Anion gap: 9 (ref 5–15)
BUN: 44 mg/dL — ABNORMAL HIGH (ref 6–20)
BUN: 29 mg/dL — ABNORMAL HIGH (ref 6–20)
CO2: 25 mmol/L (ref 22–32)
CO2: 27 mmol/L (ref 22–32)
Calcium: 8.1 mg/dL — ABNORMAL LOW (ref 8.9–10.3)
Calcium: 8.3 mg/dL — ABNORMAL LOW (ref 8.9–10.3)
Chloride: 99 mmol/L (ref 98–111)
Chloride: 102 mmol/L (ref 98–111)
Creatinine, Ser: 0.64 mg/dL (ref 0.44–1.00)
Creatinine, Ser: 0.45 mg/dL (ref 0.44–1.00)
GFR calc Af Amer: >60 mL/min (ref >60)
GFR calc Af Amer: >60 mL/min (ref >60)
GFR calc non Af Amer: >60 mL/min (ref >60)
GFR calc non Af Amer: >60 mL/min (ref >60)
Glucose, Bld: 190 mg/dL — ABNORMAL HIGH (ref 70–99)
Glucose, Bld: 122 mg/dL — ABNORMAL HIGH (ref 70–99)
Potassium: 3.6 mmol/L (ref 3.5–5.1)
Potassium: 3.2 mmol/L — ABNORMAL LOW (ref 3.5–5.1)
Sodium: 134 mmol/L — ABNORMAL LOW (ref 135–145)
Sodium: 138 mmol/L (ref 135–145)

## 2020-05-27 LAB — BLOOD GAS, VENOUS
Acid-base deficit: 0 mmol/L (ref 0.0–2.0)
Bicarbonate: 25 mmol/L (ref 20.0–28.0)
O2 Saturation: 63.6 %
Patient temperature: 98.6
pCO2, Ven: 44.4 mmHg (ref 44.0–60.0)
pH, Ven: 7.37 (ref 7.250–7.430)
pO2, Ven: 39 mmHg (ref 32.0–45.0)

## 2020-05-27 LAB — CBC
HCT: 48.7 % — ABNORMAL HIGH (ref 36.0–46.0)
Hemoglobin: 17 g/dL — ABNORMAL HIGH (ref 12.0–15.0)
MCH: 29.8 pg (ref 26.0–34.0)
MCHC: 34.9 g/dL (ref 30.0–36.0)
MCV: 85.4 fL (ref 80.0–100.0)
Platelets: 464 10*3/uL — ABNORMAL HIGH (ref 150–400)
RBC: 5.7 MIL/uL — ABNORMAL HIGH (ref 3.87–5.11)
RDW: 12.4 % (ref 11.5–15.5)
WBC: 28.8 10*3/uL — ABNORMAL HIGH (ref 4.0–10.5)
nRBC: 0 % (ref 0.0–0.2)

## 2020-05-27 LAB — COMPREHENSIVE METABOLIC PANEL
ALT: 15 U/L (ref 0–44)
AST: 22 U/L (ref 15–41)
Albumin: 4.2 g/dL (ref 3.5–5.0)
Alkaline Phosphatase: 73 U/L (ref 38–126)
Anion gap: 25 — ABNORMAL HIGH (ref 5–15)
BUN: 59 mg/dL — ABNORMAL HIGH (ref 6–20)
CO2: 20 mmol/L — ABNORMAL LOW (ref 22–32)
Calcium: 10.4 mg/dL — ABNORMAL HIGH (ref 8.9–10.3)
Chloride: 89 mmol/L — ABNORMAL LOW (ref 98–111)
Creatinine, Ser: 1.16 mg/dL — ABNORMAL HIGH (ref 0.44–1.00)
GFR calc Af Amer: >60 mL/min (ref >60)
GFR calc non Af Amer: >60 mL/min (ref >60)
Glucose, Bld: 213 mg/dL — ABNORMAL HIGH (ref 70–99)
Potassium: 4.3 mmol/L (ref 3.5–5.1)
Sodium: 134 mmol/L — ABNORMAL LOW (ref 135–145)
Total Bilirubin: 1.2 mg/dL (ref 0.3–1.2)
Total Protein: 8.8 g/dL — ABNORMAL HIGH (ref 6.5–8.1)

## 2020-05-27 LAB — LACTIC ACID, PLASMA
Lactic Acid, Venous: 1.3 mmol/L (ref 0.5–1.9)
Lactic Acid, Venous: 1 mmol/L (ref 0.5–1.9)

## 2020-05-27 LAB — PROCALCITONIN: Procalcitonin: <0.1 ng/mL

## 2020-05-27 LAB — CBG MONITORING, ED
Glucose-Capillary: 217 mg/dL — ABNORMAL HIGH (ref 70–99)
Glucose-Capillary: 181 mg/dL — ABNORMAL HIGH (ref 70–99)
Glucose-Capillary: 162 mg/dL — ABNORMAL HIGH (ref 70–99)
Glucose-Capillary: 166 mg/dL — ABNORMAL HIGH (ref 70–99)
Glucose-Capillary: 109 mg/dL — ABNORMAL HIGH (ref 70–99)

## 2020-05-27 LAB — LIPASE, BLOOD: Lipase: 19 U/L (ref 11–51)

## 2020-05-27 LAB — I-STAT BETA HCG BLOOD, ED (MC, WL, AP ONLY): I-stat hCG, quantitative: <5 m[IU]/mL (ref <5)

## 2020-05-27 MED ORDER — SODIUM CHLORIDE 0.9 % IV BOLUS
1000.0000 mL | Freq: Once | INTRAVENOUS | Status: AC
  Administered 2020-05-27: 1000 mL via INTRAVENOUS

## 2020-05-27 MED ORDER — ACETAMINOPHEN 325 MG PO TABS: 650.0000 mg | ORAL_TABLET | Freq: Four times a day (QID) | ORAL | Status: DC | PRN

## 2020-05-27 MED ORDER — INSULIN DETEMIR 100 UNIT/ML ~~LOC~~ SOLN
10.0000 [IU] | Freq: Every day | SUBCUTANEOUS | Status: DC
  Filled 2020-05-27: qty 0.1

## 2020-05-27 MED ORDER — ACETAMINOPHEN 650 MG RE SUPP: 650.0000 mg | Freq: Four times a day (QID) | RECTAL | Status: DC | PRN

## 2020-05-27 MED ORDER — INSULIN REGULAR(HUMAN) IN NACL 100-0.9 UT/100ML-% IV SOLN: INTRAVENOUS | Status: DC

## 2020-05-27 MED ORDER — LACTATED RINGERS IV SOLN: INTRAVENOUS | Status: DC

## 2020-05-27 MED ORDER — SODIUM CHLORIDE 0.9 % IV SOLN: INTRAVENOUS | Status: DC

## 2020-05-27 MED ORDER — DEXTROSE IN LACTATED RINGERS 5 % IV SOLN: INTRAVENOUS | Status: DC

## 2020-05-27 MED ORDER — SODIUM CHLORIDE 0.9 % IV SOLN: 2.0000 g | Freq: Once | INTRAVENOUS | Status: DC

## 2020-05-27 MED ORDER — SODIUM CHLORIDE 0.9 % IV SOLN
2.0000 g | Freq: Two times a day (BID) | INTRAVENOUS | Status: DC
  Administered 2020-05-28: 2 g via INTRAVENOUS
  Filled 2020-05-27: qty 2

## 2020-05-27 MED ORDER — VANCOMYCIN HCL 1250 MG/250ML IV SOLN
1250.0000 mg | INTRAVENOUS | Status: AC
  Administered 2020-05-27: 1250 mg via INTRAVENOUS
  Filled 2020-05-27: qty 250

## 2020-05-27 MED ORDER — ONDANSETRON HCL 4 MG PO TABS: 4.0000 mg | ORAL_TABLET | Freq: Four times a day (QID) | ORAL | Status: DC | PRN

## 2020-05-27 MED ORDER — ONDANSETRON HCL 4 MG/2ML IJ SOLN
4.0000 mg | Freq: Once | INTRAMUSCULAR | Status: AC
  Administered 2020-05-27: 4 mg via INTRAVENOUS
  Filled 2020-05-27: qty 2

## 2020-05-27 MED ORDER — VANCOMYCIN HCL IN DEXTROSE 1-5 GM/200ML-% IV SOLN: 1000.0000 mg | Freq: Once | INTRAVENOUS | Status: DC

## 2020-05-27 MED ORDER — VANCOMYCIN HCL 750 MG/150ML IV SOLN: 750.0000 mg | INTRAVENOUS | Status: DC

## 2020-05-27 MED ORDER — ONDANSETRON HCL 4 MG/2ML IJ SOLN: 4.0000 mg | Freq: Once | INTRAMUSCULAR | Status: DC | PRN

## 2020-05-27 MED ORDER — SODIUM CHLORIDE 0.9 % IV SOLN
2.0000 g | INTRAVENOUS | Status: AC
  Administered 2020-05-27: 2 g via INTRAVENOUS
  Filled 2020-05-27: qty 2

## 2020-05-27 MED ORDER — INSULIN ASPART 100 UNIT/ML ~~LOC~~ SOLN
0.0000 [IU] | SUBCUTANEOUS | Status: DC
  Administered 2020-05-27: 2 [IU] via SUBCUTANEOUS
  Filled 2020-05-27: qty 0.09

## 2020-05-27 MED ORDER — ONDANSETRON HCL 4 MG/2ML IJ SOLN
4.0000 mg | Freq: Four times a day (QID) | INTRAMUSCULAR | Status: DC | PRN
  Administered 2020-05-27 – 2020-05-28 (×2): 4 mg via INTRAVENOUS
  Filled 2020-05-27 (×2): qty 2

## 2020-05-27 MED ORDER — ENOXAPARIN SODIUM 40 MG/0.4ML ~~LOC~~ SOLN
40.0000 mg | SUBCUTANEOUS | Status: DC
  Administered 2020-05-27 – 2020-05-28 (×2): 40 mg via SUBCUTANEOUS
  Filled 2020-05-27 (×2): qty 0.4

## 2020-05-27 MED ORDER — DEXTROSE 50 % IV SOLN: 0.0000 mL | INTRAVENOUS | Status: DC | PRN

## 2020-05-27 NOTE — ED Provider Notes (Signed)
Care assumed from Dr. Wilkie Aye at shift change.  Per previous team, patient was recently admitted for new DKA in the setting of Covid.  According to previous team's documentation on my assessment patient, she has had nausea and vomiting for several days as well as some continued cough.  She was feeling dehydrated.  Work-up today shows concern for a new leukocytosis that is worse than prior as well as elevated anion gap and decreased CO2.  Her VBG did not show significant acidosis and her ketones were present in the urine slightly.  Her tachycardia on arrival was in the 150s and is now improved into the 130s despite several liters of fluids.  I do not feel she is in full on DKA but I am concerned she has heading that way.    Given her known Covid, continued nausea and vomiting, the dehydration, and the electrolyte abnormalities concerning for early DKA, the previous team felt she needs admission once her work-up was completed.  We will order similar fluids for her and will call for admission, will discuss with admitting team if they feel that anion gap needs to be treated with an insulin drip or not.  She will be admitted for further management.    Clinical Impression: 1. Dehydration   2. Tachycardia   3. Increased anion gap metabolic acidosis   4. Intractable vomiting with nausea, unspecified vomiting type   5. COVID-19     Disposition: Admit  This note was prepared with assistance of Dragon voice recognition software. Occasional wrong-word or sound-a-like substitutions may have occurred due to the inherent limitations of voice recognition software.       Burman Bruington, Canary Brim, MD 05/27/20 1455

## 2020-05-27 NOTE — ED Notes (Addendum)
Per Dr. Ronaldo Miyamoto, patient will be telemetry. 2W RN made aware.

## 2020-05-27 NOTE — ED Triage Notes (Signed)
Patient states she has had cough and emesis x3 days. Patient recently admitted for DKA and COVID on 9/9.

## 2020-05-27 NOTE — ED Notes (Signed)
Patient given diet ginger ale.

## 2020-05-27 NOTE — ED Notes (Signed)
Hospitalist at bedside 

## 2020-05-27 NOTE — ED Provider Notes (Addendum)
[place] DEPT Provider Note   CSN: [id number] Arrival date & time: 05/27/20  0459     History Chief Complaint  Patient presents with  . Emesis  . Cough    Taylor [name] is a 34 y.o. female.  HPI     This is a 34 year old female with a history of diabetes and recent Covid pneumonia who presents with nausea and vomiting.  Patient reports 1 day history of worsening nonbilious, nonbloody emesis.  Denies abdominal pain or diarrhea.  She tested positive for COVID-19 on September 9 and was admitted to the hospital for Covid pneumonia and hypoxia.  She has also noted to be in DKA.  When I asked her about DKA, patient states "I do not know what you mean."  She reports that she has not had any ongoing fevers.  She reports occasional burning chest pain but none at this time.  She has residual nonproductive cough.  She states she has been unable to keep anything down.  Past Medical History:  Diagnosis Date  . Diabetes mellitus   . Gestational diabetes     Patient Active Problem List   Diagnosis Date Noted  . Pneumonia due to COVID-19 virus 05/12/2020  . Metabolic acidosis   . DKA (diabetic ketoacidoses) ([place]) 03/04/2016  . AKI (acute kidney injury) ([place])   . Diabetic ketoacidosis without coma ([place])   . DIABETES MELLITUS 09/24/2009  . HYPERLIPIDEMIA 09/24/2009  . POLYDIPSIA 09/24/2009  . POLYURIA 09/24/2009    Past Surgical History:  Procedure Laterality Date  . NO PAST SURGERIES       OB History    Gravida  2   Para  1   Term  1   Preterm  0   AB  0   Living  1     SAB  0   TAB  0   Ectopic  0   Multiple  0   Live Births              No family history on file.  Social History   Tobacco Use  . Smoking status: Never Smoker  . Smokeless tobacco: Never Used  Substance Use Topics  . Alcohol use: No  . Drug use: No    Home Medications Prior to Admission medications   Medication Sig Start Date End Date Taking?  Authorizing Provider  blood glucose meter kit and supplies KIT Dispense based on patient and insurance preference. Use up to four times daily as directed. (FOR ICD-9 250.00, 250.01).  Diagnosis: Insulin dependent Diabetes mellitus. ICD 10 code E11.9 03/06/16   Rai, Ripudeep K, MD  insulin detemir (LEVEMIR) 100 UNIT/ML FlexPen Inject 20 Units into the skin daily. 05/15/20   Charlynne Cousins, MD  insulin lispro (HUMALOG) 200 UNIT/ML KwikPen Inject 10 Units into the skin 3 (three) times daily before meals. 05/15/20   Charlynne Cousins, MD  Insulin Pen Needle 31G X 6 MM MISC 1 Device by Does not apply route 2 (two) times daily. 05/15/20   Charlynne Cousins, MD    Allergies    Patient has no known allergies.  Review of Systems   Review of Systems  Constitutional: Negative for fever.  Respiratory: Positive for cough. Negative for shortness of breath.   Cardiovascular: Positive for chest pain.  Gastrointestinal: Positive for nausea and vomiting. Negative for abdominal pain, constipation and diarrhea.  Genitourinary: Negative for dysuria.  All other systems reviewed and are negative.   Physical Exam Updated  Vital Signs BP 113/83 (BP Location: Right Arm)   Pulse (!) 151   Temp 97.7 F (36.5 C) (Oral)   Resp 18   Ht 1.524 m (5')   Wt 54 kg   LMP 05/03/2020   SpO2 100%   BMI 23.24 kg/m   Physical Exam Vitals and nursing note reviewed.  Constitutional:      Appearance: She is well-developed. She is ill-appearing. She is not diaphoretic.  HENT:     Head: Normocephalic and atraumatic.     Mouth/Throat:     Mouth: Mucous membranes are dry.  Eyes:     Pupils: Pupils are equal, round, and reactive to light.  Cardiovascular:     Rate and Rhythm: Regular rhythm. Tachycardia present.     Heart sounds: Normal heart sounds.  Pulmonary:     Effort: Pulmonary effort is normal. No respiratory distress.     Breath sounds: No wheezing.  Abdominal:     General: Bowel sounds are normal.      Palpations: Abdomen is soft.     Tenderness: There is no abdominal tenderness. There is no guarding or rebound.  Musculoskeletal:     Cervical back: Neck supple.     Right lower leg: No edema.     Left lower leg: No edema.  Skin:    General: Skin is warm and dry.  Neurological:     Mental Status: She is alert and oriented to person, place, and time.  Psychiatric:        Mood and Affect: Mood normal.     ED Results / Procedures / Treatments   Labs (all labs ordered are listed, but only abnormal results are displayed) Labs Reviewed  CBC - Abnormal; Notable for the following components:      Result Value   WBC 28.8 (*)    RBC 5.70 (*)    Hemoglobin 17.0 (*)    HCT 48.7 (*)    Platelets 464 (*)    All other components within normal limits  CBG MONITORING, ED - Abnormal; Notable for the following components:   Glucose-Capillary 217 (*)    All other components within normal limits  CULTURE, BLOOD (ROUTINE X 2)  CULTURE, BLOOD (ROUTINE X 2)  LIPASE, BLOOD  COMPREHENSIVE METABOLIC PANEL  URINALYSIS, ROUTINE W REFLEX MICROSCOPIC  BLOOD GAS, VENOUS  LACTIC ACID, PLASMA  LACTIC ACID, PLASMA  I-STAT BETA HCG BLOOD, ED ([facility], [facility], [facility] ONLY)    EKG None ED ECG REPORT   Date: 05/27/2020  Rate: 147  Rhythm: sinus tachycardia  QRS Axis: normal  Intervals: QT prolonged  ST/T Wave abnormalities: normal  Conduction Disutrbances:none  Narrative Interpretation:   Old EKG Reviewed: none available  I have personally reviewed the EKG tracing and agree with the computerized printout as noted.  Radiology DG Chest Portable 1 View  Result Date: 05/27/2020 CLINICAL DATA:  Cough and emesis for 3 days EXAM: PORTABLE CHEST 1 VIEW COMPARISON:  05/10/2020 FINDINGS: Normal heart size and mediastinal contours. No acute infiltrate or edema. No effusion or pneumothorax. No acute osseous findings. IMPRESSION: Negative chest. Electronically Signed   By: Monte Fantasia M.D.   On: 05/27/2020 07:02     Procedures Procedures (including critical care time)  Medications Ordered in ED Medications  sodium chloride 0.9 % bolus 1,000 mL (has no administration in time range)  sodium chloride 0.9 % bolus 1,000 mL (1,000 mLs Intravenous New Bag/Given 05/27/20 0629)  ondansetron (ZOFRAN) injection 4 mg (4 mg Intravenous Given 05/27/20  0600)    ED Course  I have reviewed the triage vital signs and the nursing notes.  Pertinent labs & imaging results that were available during my care of the patient were reviewed by me and considered in my medical decision making (see chart for details).    MDM Rules/Calculators/A&P                          Patient presents with nausea and vomiting.  Noted to be tachycardic in the 150s.  She is afebrile and otherwise nontoxic appearing.  Clinically she appears very dry.  She has a history of recent COVID-19 pneumonia and DKA.  Patient was given 2 L of fluid.  Work-up was initiated.  Given chest discomfort, chest x-ray was obtained.  No residual evidence of COVID-19 or pneumonia.  White count noted to be 28.  Her CBC does appear hemoconcentrated however.  Lactate, blood cultures, VBG ordered.  Work-up is pending.  Considerations include acute infection, DKA.  Abdomen is nontender and she is only vomiting.  Question intra-abdominal process.patient signed out to Dr. Sherry Ruffing.  Final Clinical Impression(s) / ED Diagnoses Final diagnoses:  None    Rx / DC Orders ED Discharge Orders    None       Tiahna Cure, Barbette Hair, MD 05/27/20 6384    Merryl Hacker, MD 05/27/20 [phone]

## 2020-05-27 NOTE — ED Notes (Signed)
XR at bedside

## 2020-05-27 NOTE — ED Notes (Signed)
Patient given ice chips. 

## 2020-05-27 NOTE — H&P (Addendum)
History and Physical    Taylor [name] IDH:[id number] DOB: 07-Jun-1986 DOA: 05/27/2020  PCP: Patient, No Pcp Per  Patient coming from: Home  Chief Complaint: Intractable N/V  HPI: Taylor [name] is a 34 y.o. female with medical history significant of recent COVID 19 infection, IDDM. Presents with a 2-day history of intractable N/V. She reports that she has generally felt tired since her release from the hospital for COVID PNA. However starting Friday, she began several episodes of nonbloody emesis. There was no accompanying abdominal pain or diarrhea. She tried using pepto bismol, but that did not provide relief. These episodes progressively worsened through morning. She reports compliance with her insulin. She reports nausea occurs w/ solids and liquid. She became concerned about her condition and came to the ED.   ED Course: She was found to have ketouria, an elevated WBC, AKI. Imaging was negative. [facility] was called for admission.   Review of Systems:  Denies CP, palpitations, HA, dyspnea, ab pain. Review of systems is otherwise negative for all not mentioned in HPI.   Past Medical History:  Diagnosis Date   Diabetes mellitus    Gestational diabetes     Past Surgical History:  Procedure Laterality Date   NO PAST SURGERIES       reports that she has never smoked. She has never used smokeless tobacco. She reports that she does not drink alcohol and does not use drugs.  No Known Allergies  No family history on file.  Prior to Admission medications   Medication Sig Start Date End Date Taking? Authorizing Provider  insulin detemir (LEVEMIR) 100 UNIT/ML FlexPen Inject 20 Units into the skin daily. 05/15/20  Yes Taylor Cousins, MD  insulin lispro (HUMALOG) 200 UNIT/ML KwikPen Inject 10 Units into the skin 3 (three) times daily before meals. 05/15/20  Yes Taylor Cousins, MD  blood glucose meter kit and supplies KIT Dispense based on patient and insurance preference. Use up to four  times daily as directed. (FOR ICD-9 250.00, 250.01).  Diagnosis: Insulin dependent Diabetes mellitus. ICD 10 code E11.9 03/06/16   Rai, Ripudeep K, MD  Insulin Pen Needle 31G X 6 MM MISC 1 Device by Does not apply route 2 (two) times daily. 05/15/20   Taylor Cousins, MD    Physical Exam: Vitals:   05/27/20 1230 05/27/20 1257 05/27/20 1300 05/27/20 1324  BP: (!) 129/104 (!) 129/104 (!) 143/106   Pulse: (!) 117 (!) 111 (!) 111   Resp: _0 Temp:    98.4 F (36.9 C)  TempSrc:      SpO2: 100% 100% 100%   Weight:      Height:        General: 34 y.o. female resting in bed in NAD Eyes: PERRL, normal sclera ENMT: Nares patent w/o discharge, orophaynx clear, dentition normal, ears w/o discharge/lesions/ulcers Neck: Supple, trachea midline Cardiovascular: RRR, +S1, S2, no m/g/r, equal pulses throughout Respiratory: CTABL, no w/r/r, normal WOB GI: BS+, NDNT, no masses noted, no organomegaly noted MSK: No e/c/c Skin: No rashes, bruises, ulcerations noted Neuro: A&O x 3, no focal deficits Psyc: Appropriate interaction and affect, calm/cooperative  Labs on Admission: I have personally reviewed following labs and imaging studies  CBC: Recent Labs  Lab 05/27/20 0516  WBC 28.8*  HGB 17.0*  HCT 48.7*  MCV 85.4  PLT 902*   Basic Metabolic Panel: Recent Labs  Lab 05/27/20 0516  NA 134*  K 4.3  CL 89*  CO2 20*  GLUCOSE 213*  BUN 59*  CREATININE 1.16*  CALCIUM 10.4*   GFR: Estimated Creatinine Clearance: 50.5 mL/min (A) (by C-G formula based on SCr of 1.16 mg/dL (H)). Liver Function Tests: Recent Labs  Lab 05/27/20 0516  AST 22  ALT 15  ALKPHOS 73  BILITOT 1.2  PROT 8.8*  ALBUMIN 4.2   Recent Labs  Lab 05/27/20 0516  LIPASE 19   No results for input(s): AMMONIA in the last 168 hours. Coagulation Profile: No results for input(s): INR, PROTIME in the last 168 hours. Cardiac Enzymes: No results for input(s): CKTOTAL, CKMB, CKMBINDEX, TROPONINI in the  last 168 hours. BNP (last 3 results) No results for input(s): PROBNP in the last 8760 hours. HbA1C: No results for input(s): HGBA1C in the last 72 hours. CBG: Recent Labs  Lab 05/27/20 0521 05/27/20 1143  GLUCAP 217* 181*   Lipid Profile: No results for input(s): CHOL, HDL, LDLCALC, TRIG, CHOLHDL, LDLDIRECT in the last 72 hours. Thyroid Function Tests: No results for input(s): TSH, T4TOTAL, FREET4, T3FREE, THYROIDAB in the last 72 hours. Anemia Panel: No results for input(s): VITAMINB12, FOLATE, FERRITIN, TIBC, IRON, RETICCTPCT in the last 72 hours. Urine analysis:    Component Value Date/Time   COLORURINE Xochil (A) 05/27/2020 0726   APPEARANCEUR HAZY (A) 05/27/2020 0726   LABSPEC 1.021 05/27/2020 0726   PHURINE 5.0 05/27/2020 0726   GLUCOSEU 50 (A) 05/27/2020 0726   HGBUR SMALL (A) 05/27/2020 0726   HGBUR negative 09/24/2009 0837   BILIRUBINUR SMALL (A) 05/27/2020 0726   KETONESUR 20 (A) 05/27/2020 0726   PROTEINUR 100 (A) 05/27/2020 0726   UROBILINOGEN 0.2 04/09/2011 1725   NITRITE NEGATIVE 05/27/2020 0726   LEUKOCYTESUR SMALL (A) 05/27/2020 0726    Radiological Exams on Admission: DG Chest Portable 1 View  Result Date: 05/27/2020 CLINICAL DATA:  Cough and emesis for 3 days EXAM: PORTABLE CHEST 1 VIEW COMPARISON:  05/10/2020 FINDINGS: Normal heart size and mediastinal contours. No acute infiltrate or edema. No effusion or pneumothorax. No acute osseous findings. IMPRESSION: Negative chest. Electronically Signed   By: Monte Fantasia M.D.   On: 05/27/2020 07:02   Assessment/Plan Sepsis Intractable N/V Thtombocytosis     - admit inpt, telemetry     - criteria: HR108 after fluid resuscitation, WBC 28.8, SCR is greater than twice baseline, unknown source - presumed abdominal     - vanc, cefepime, fluids     - Bld Cx, KUB, procal     - recent hospitalization for COVID 19; but denies respiratory symptoms and CXR is clear     - denies urinary symptoms     - zofran, NPO  for now  IDDM Hyperglycemia w/ elevated anion gap and marginal acidosis     - repeat BMP now and again in evening     - initially was going to go to stepdown for insulin gtt, but follow up BMP shows a closed gap; can go to telemetry with SQ insulin, CLD  AKI Dehydration     - d/t above     - fluids  DVT prophylaxis: lovenox  Code Status: FULL  Family Communication: None at bedside  Consults called: None  Admission status: Inpatient d/t IV interventions and treatment not appropriate for outpt administration.   Status is: Inpatient  Remains inpatient appropriate because:Inpatient level of care appropriate due to severity of illness   Dispo: The patient is from: Home              Anticipated d/c is to:  Home              Anticipated d/c date is: 3 days              Patient currently is not medically stable to d/c.  Time spent coordinating admission: 70 minutes  [facility]  If 7PM-7AM, please contact night-coverage [email or web address]  05/27/2020, 1:32 PM

## 2020-05-27 NOTE — Progress Notes (Signed)
Pharmacy Antibiotic Note  Taylor [name] is a 34 y.o. female with a h/o DM and recent COVID PNA admitted on 05/27/2020 with worsening N/V and noted to be in DKA with leukocytosis and tachycardic. Pharmacy has been consulted for vancomycin and cefepime dosing empirically.   Plan: Cefepime 2 g iv q 12 hours  Vancomycin 1250 mg iv once followed by 750 mg iv q 24 hours  Will f/u renal function, culture results, and clinical course  Height: 5' (152.4 cm) Weight: 54 kg (119 lb) IBW/kg (Calculated) : 45.5  Temp (24hrs), Avg:98.1 F (36.7 C), Min:97.7 F (36.5 C), Max:98.4 F (36.9 C)  Recent Labs  Lab 05/27/20 0516 05/27/20 0726  WBC 28.8*  --   CREATININE 1.16*  --   LATICACIDVEN  --  1.0  1.3    Estimated Creatinine Clearance: 50.5 mL/min (A) (by C-G formula based on SCr of 1.16 mg/dL (H)).    No Known Allergies  Antimicrobials this admission: 9/26 vancomycin >>  9/26 cefepime >>   Dose adjustments this admission:   Microbiology results: 9/25 BCx: pending  Thank you for allowing pharmacy to be a part of this patient's care.  Luisa Hart D 05/27/2020 1:34 PM

## 2020-05-28 LAB — GLUCOSE, CAPILLARY
Glucose-Capillary: 118 mg/dL — ABNORMAL HIGH (ref 70–99)
Glucose-Capillary: 61 mg/dL — ABNORMAL LOW (ref 70–99)
Glucose-Capillary: 67 mg/dL — ABNORMAL LOW (ref 70–99)

## 2020-05-28 LAB — MAGNESIUM: Magnesium: 2.1 mg/dL (ref 1.7–2.4)

## 2020-05-28 LAB — CORTISOL-AM, BLOOD: Cortisol - AM: 19.4 ug/dL (ref 6.7–22.6)

## 2020-05-28 LAB — BASIC METABOLIC PANEL
Anion gap: 9 (ref 5–15)
BUN: 22 mg/dL — ABNORMAL HIGH (ref 6–20)
CO2: 25 mmol/L (ref 22–32)
Calcium: 7.9 mg/dL — ABNORMAL LOW (ref 8.9–10.3)
Chloride: 104 mmol/L (ref 98–111)
Creatinine, Ser: 0.38 mg/dL — ABNORMAL LOW (ref 0.44–1.00)
GFR calc Af Amer: >60 mL/min (ref >60)
GFR calc non Af Amer: >60 mL/min (ref >60)
Glucose, Bld: 91 mg/dL (ref 70–99)
Potassium: 3 mmol/L — ABNORMAL LOW (ref 3.5–5.1)
Sodium: 138 mmol/L (ref 135–145)

## 2020-05-28 LAB — CBG MONITORING, ED
Glucose-Capillary: 109 mg/dL — ABNORMAL HIGH (ref 70–99)
Glucose-Capillary: 205 mg/dL — ABNORMAL HIGH (ref 70–99)
Glucose-Capillary: 77 mg/dL (ref 70–99)
Glucose-Capillary: 90 mg/dL (ref 70–99)
Glucose-Capillary: 90 mg/dL (ref 70–99)

## 2020-05-28 LAB — PROCALCITONIN: Procalcitonin: <0.1 ng/mL

## 2020-05-28 LAB — PROTIME-INR
INR: 1.2 (ref 0.8–1.2)
Prothrombin Time: 15 seconds (ref 11.4–15.2)

## 2020-05-28 MED ORDER — METOCLOPRAMIDE HCL 5 MG/ML IJ SOLN
5.0000 mg | Freq: Three times a day (TID) | INTRAMUSCULAR | Status: DC
  Administered 2020-05-28: 5 mg via INTRAVENOUS
  Filled 2020-05-28: qty 2

## 2020-05-28 MED ORDER — SODIUM CHLORIDE 0.9 % IV SOLN
2.0000 g | Freq: Three times a day (TID) | INTRAVENOUS | Status: DC
  Administered 2020-05-28 – 2020-05-29 (×4): 2 g via INTRAVENOUS
  Filled 2020-05-28 (×4): qty 2

## 2020-05-28 MED ORDER — INSULIN ASPART 100 UNIT/ML ~~LOC~~ SOLN
0.0000 [IU] | Freq: Three times a day (TID) | SUBCUTANEOUS | Status: DC
  Administered 2020-05-28: 5 [IU] via SUBCUTANEOUS
  Administered 2020-05-29: 2 [IU] via SUBCUTANEOUS
  Administered 2020-05-29: 3 [IU] via SUBCUTANEOUS
  Filled 2020-05-28: qty 0.15

## 2020-05-28 MED ORDER — INSULIN DETEMIR 100 UNIT/ML ~~LOC~~ SOLN
20.0000 [IU] | Freq: Every day | SUBCUTANEOUS | Status: DC
  Administered 2020-05-29: 20 [IU] via SUBCUTANEOUS
  Filled 2020-05-28: qty 0.2

## 2020-05-28 MED ORDER — SENNOSIDES-DOCUSATE SODIUM 8.6-50 MG PO TABS
1.0000 | ORAL_TABLET | Freq: Two times a day (BID) | ORAL | Status: DC
  Administered 2020-05-28 – 2020-05-29 (×3): 1 via ORAL
  Filled 2020-05-28 (×3): qty 1

## 2020-05-28 MED ORDER — INSULIN ASPART 100 UNIT/ML ~~LOC~~ SOLN
0.0000 [IU] | Freq: Every day | SUBCUTANEOUS | Status: DC
  Filled 2020-05-28: qty 0.05

## 2020-05-28 MED ORDER — INSULIN DETEMIR 100 UNIT/ML ~~LOC~~ SOLN
10.0000 [IU] | Freq: Every day | SUBCUTANEOUS | Status: DC
  Administered 2020-05-28: 10 [IU] via SUBCUTANEOUS
  Filled 2020-05-28: qty 0.1

## 2020-05-28 MED ORDER — POLYETHYLENE GLYCOL 3350 17 G PO PACK
17.0000 g | PACK | Freq: Every day | ORAL | Status: DC
  Administered 2020-05-28 – 2020-05-29 (×2): 17 g via ORAL
  Filled 2020-05-28 (×2): qty 1

## 2020-05-28 MED ORDER — POTASSIUM CHLORIDE CRYS ER 20 MEQ PO TBCR
40.0000 meq | EXTENDED_RELEASE_TABLET | ORAL | Status: AC
  Administered 2020-05-28 (×2): 40 meq via ORAL
  Filled 2020-05-28 (×2): qty 2

## 2020-05-28 MED ORDER — METOCLOPRAMIDE HCL 5 MG/ML IJ SOLN
5.0000 mg | Freq: Three times a day (TID) | INTRAMUSCULAR | Status: DC
  Administered 2020-05-28 – 2020-05-29 (×4): 5 mg via INTRAVENOUS
  Filled 2020-05-28 (×4): qty 2

## 2020-05-28 NOTE — ED Notes (Signed)
Provided pt with dinner tray. Pt position adjusted in bed, pt comfortable at this time

## 2020-05-28 NOTE — Progress Notes (Addendum)
[facility] Progress Note  Patient: Taylor [name]    ZOX:[id number]  DOA: 05/27/2020     Date of Service: the patient was seen and examined on 05/28/2020  Brief hospital course: Past medical history of IDDM with recent COVID-19 pneumonia.  Presents with complaints of intractable nausea and vomiting. Currently plan is continue to treat hyperglycemia and nausea.  Assessment and Plan: 1.  IDDM Severe hyperglycemia without DKA Suspecting gastroparesis Intractable nausea and vomiting. Constipation Presents with complaints of nausea and vomiting. Labs show hyperglycemia blood glucose in 213. Anion gap of 25. Bicarb of 20. Lipase normal. Urine had mild ketonuria.  No acidosis on venous blood gas.  Lactic acid was normal. Patient was initially started on IV fluids as well as IV insulin with Endo tool protocol. As patient's repeat anion gap was closed.  IV insulin were discontinued.  Patient is currently on basal bolus regimen with Premeal coverage. Suspect that her presentation is secondary to undiagnosed gastroparesis.  Will treat with Reglan and monitor. X-ray abdomen negative for any acute abnormality other than mild stool burden. We will initiate bowel regimen.  2.  Concern for sepsis, more likely SIRS in the setting of hyperglycemia and gastroparesis Presents with abdominal pain, nausea vomiting. On presentation tachycardic with severe leukocytosis and anion gap. UA had mild pyuria. Patient is started on IV vancomycin and cefepime with concern for sepsis. Currently since the source of infection potentially is intra-abdominal therefore I will discontinue vancomycin. Cefepime is currently continued but low threshold to discontinue, as I do not suspect that the patient is suffering from sepsis. Procalcitonin negative x2.  3.  Hypokalemia. Replacing potassium orally.  4.  Polycythemia Likely dehydration. Monitor. Prior hemoglobin normal.  5.  Recent COVID-19 pneumonia. Patient  was recently hospitalized and treated for COVID-19 pneumonia. Currently her presentation is not consistent with COVID-19 although still patient is within 21 days of initial diagnosis therefore will maintain isolation.  Diet: Carb modified diet DVT Prophylaxis:   enoxaparin (LOVENOX) injection 40 mg Start: 05/27/20 2200    Advance goals of care discussion: Full code  Family Communication: no family was present at bedside, at the time of interview.   Disposition:  Status is: Inpatient  Remains inpatient appropriate because:IV treatments appropriate due to intensity of illness or inability to take PO   Dispo: The patient is from: Home              Anticipated d/c is to: Home              Anticipated d/c date is: 1 day              Patient currently is not medically stable to d/c.        Subjective: Continues to report nausea without any vomiting.  Has mild abdominal discomfort but no pain.  No fever no chills.  Physical Exam:  General: Appear in mild distress, no Rash; Oral Mucosa Clear, moist. no Abnormal Neck Mass Or lumps, Conjunctiva normal  Cardiovascular: S1 and S2 Present, no Murmur, Respiratory: good respiratory effort, Bilateral Air entry present and CTA, no Crackles, no wheezes Abdomen: Bowel Sound present, Soft and no tenderness Extremities: no Pedal edema Neurology: alert and oriented to time, place, and person affect appropriate. no new focal deficit Gait not checked due to patient safety concerns  Vitals:   05/28/20 1700 05/28/20 1730 05/28/20 1831 05/28/20 1834  BP: (!) 131/96 (!) 147/96 125/84   Pulse: (!) 115 96 (!) 105   Resp: 15  20 (!) 21 18  Temp:   99.5 F (37.5 C)   TempSrc:      SpO2: 98% 97% 100%   Weight:      Height:        Intake/Output Summary (Last 24 hours) at 05/28/2020 1848 Last data filed at 05/28/2020 6283 Gross per 24 hour  Intake 200 ml  Output --  Net 200 ml   Filed Weights   05/27/20 0507  Weight: 54 kg    Data  Reviewed: I have personally reviewed and interpreted daily labs, tele strips, imagings as discussed above. I reviewed all nursing notes, pharmacy notes, vitals, pertinent old records I have discussed plan of care as described above with RN and patient/family.  CBC: Recent Labs  Lab 05/27/20 0516  WBC 28.8*  HGB 17.0*  HCT 48.7*  MCV 85.4  PLT 464*   Basic Metabolic Panel: Recent Labs  Lab 05/27/20 0516 05/27/20 1325 05/27/20 2000 05/28/20 0457  NA 134* 134* 138 138  K 4.3 3.6 3.2* 3.0*  CL 89* 99 102 104  CO2 20* 25 27 25   GLUCOSE 213* 190* 122* 91  BUN 59* 44* 29* 22*  CREATININE 1.16* 0.64 0.45 0.38*  CALCIUM 10.4* 8.1* 8.3* 7.9*  MG  --   --   --  2.1    Studies: No results found.  Scheduled Meds: . enoxaparin (LOVENOX) injection  40 mg Subcutaneous Q24H  . insulin aspart  0-15 Units Subcutaneous TID WC  . insulin aspart  0-5 Units Subcutaneous QHS  . [START ON 05/29/2020] insulin detemir  20 Units Subcutaneous Daily  . metoCLOPramide (REGLAN) injection  5 mg Intravenous TID AC & HS  . polyethylene glycol  17 g Oral Daily  . senna-docusate  1 tablet Oral BID   Continuous Infusions: . sodium chloride 75 mL/hr at 05/28/20 0136  . ceFEPime (MAXIPIME) IV 2 g (05/28/20 1832)   PRN Meds: acetaminophen **OR** acetaminophen, ondansetron **OR** ondansetron (ZOFRAN) IV  Time spent: 35 minutes  Author: 05/30/20, MD [facility] 05/28/2020 6:48 PM  To reach On-call, see care teams to locate the attending and reach out via [email or web address]. Between 7PM-7AM, please contact night-coverage If you still have difficulty reaching the attending provider, please page the [facility] (Director on Call) for [facility] on amion for assistance.

## 2020-05-28 NOTE — ED Notes (Signed)
Pt given meal tray.

## 2020-05-28 NOTE — Progress Notes (Signed)
Pharmacy Antibiotic Note  Taylor [name] is a 34 y.o. female with a h/o DM and recent COVID PNA admitted on 05/27/2020 with worsening N/V and noted to be in DKA with leukocytosis and tachycardic. Pharmacy has been consulted for cefepime dosing empirically.   Plan: Cefepime 2g IV q8h Follow up renal function, culture results, and clinical course  Height: 5' (152.4 cm) Weight: 54 kg (119 lb) IBW/kg (Calculated) : 45.5  Temp (24hrs), Avg:98.4 F (36.9 C), Min:98.4 F (36.9 C), Max:98.4 F (36.9 C)  Recent Labs  Lab 05/27/20 0516 05/27/20 0726 05/27/20 1325 05/27/20 2000 05/28/20 0457  WBC 28.8*  --   --   --   --   CREATININE 1.16*  --  0.64 0.45 0.38*  LATICACIDVEN  --  1.0  1.3  --   --   --     Estimated Creatinine Clearance: 73.2 mL/min (A) (by C-G formula based on SCr of 0.38 mg/dL (L)).    No Known Allergies  Antimicrobials this admission: 9/26 vancomycin >> 9/27 9/26 cefepime >>   Dose adjustments this admission:   Microbiology results: 9/26 BCx: ngtd 9/26 UCx:   Thank you for allowing pharmacy to be a part of this patient's care.  Lynann Beaver PharmD, BCPS Clinical Pharmacist [facility] main pharmacy [phone] 05/28/2020 8:06 AM

## 2020-05-29 DIAGNOSIS — E860 Dehydration: Secondary | ICD-10-CM

## 2020-05-29 LAB — URINE CULTURE

## 2020-05-29 LAB — BASIC METABOLIC PANEL
Anion gap: 9 (ref 5–15)
BUN: 7 mg/dL (ref 6–20)
CO2: 27 mmol/L (ref 22–32)
Calcium: 8.3 mg/dL — ABNORMAL LOW (ref 8.9–10.3)
Chloride: 101 mmol/L (ref 98–111)
Creatinine, Ser: <0.3 mg/dL — ABNORMAL LOW (ref 0.44–1.00)
Glucose, Bld: 75 mg/dL (ref 70–99)
Potassium: 3.1 mmol/L — ABNORMAL LOW (ref 3.5–5.1)
Sodium: 137 mmol/L (ref 135–145)

## 2020-05-29 LAB — GLUCOSE, CAPILLARY
Glucose-Capillary: 81 mg/dL (ref 70–99)
Glucose-Capillary: 134 mg/dL — ABNORMAL HIGH (ref 70–99)
Glucose-Capillary: 158 mg/dL — ABNORMAL HIGH (ref 70–99)

## 2020-05-29 MED ORDER — SIMETHICONE 80 MG PO CHEW: 80.0000 mg | CHEWABLE_TABLET | Freq: Four times a day (QID) | ORAL | 0 refills | Status: AC | PRN

## 2020-05-29 MED ORDER — POLYETHYLENE GLYCOL 3350 17 G PO PACK: 17.0000 g | PACK | Freq: Every day | ORAL | 0 refills | Status: AC

## 2020-05-29 MED ORDER — FAMOTIDINE 20 MG PO TABS
20.0000 mg | ORAL_TABLET | Freq: Every day | ORAL | Status: DC
  Administered 2020-05-29: 20 mg via ORAL
  Filled 2020-05-29: qty 1

## 2020-05-29 MED ORDER — METOCLOPRAMIDE HCL 10 MG PO TABS: 10.0000 mg | ORAL_TABLET | Freq: Three times a day (TID) | ORAL | 0 refills | Status: AC

## 2020-05-29 MED ORDER — METOCLOPRAMIDE HCL 5 MG/ML IJ SOLN
10.0000 mg | Freq: Three times a day (TID) | INTRAMUSCULAR | Status: DC
  Administered 2020-05-29 (×2): 10 mg via INTRAVENOUS
  Filled 2020-05-29 (×2): qty 2

## 2020-05-29 MED ORDER — FAMOTIDINE 20 MG PO TABS: 20.0000 mg | ORAL_TABLET | Freq: Every day | ORAL | 0 refills | Status: AC

## 2020-05-29 MED ORDER — POTASSIUM CHLORIDE CRYS ER 20 MEQ PO TBCR
40.0000 meq | EXTENDED_RELEASE_TABLET | ORAL | Status: DC
  Administered 2020-05-29 (×2): 40 meq via ORAL
  Filled 2020-05-29 (×2): qty 2

## 2020-05-29 MED ORDER — ALUM & MAG HYDROXIDE-SIMETH 200-200-20 MG/5ML PO SUSP: 15.0000 mL | Freq: Four times a day (QID) | ORAL | Status: DC | PRN

## 2020-05-29 MED ORDER — SIMETHICONE 80 MG PO CHEW: 80.0000 mg | CHEWABLE_TABLET | Freq: Four times a day (QID) | ORAL | Status: DC | PRN

## 2020-05-29 NOTE — Discharge Summary (Signed)
[facility] Discharge Summary   Patient: Taylor [name] VHQ:[id number]  PCP: Patient, No Pcp Per  Date of admission: 05/27/2020   Date of discharge: 05/29/2020     Discharge Diagnoses:  Principal diagnosis Diabetic gastroparesis.  Active Problems:   Intractable nausea and vomiting   Admitted From: Home Disposition:  Home   Recommendations for Outpatient Follow-up:  1. PCP: Follow-up with PCP in 1 week. 2. Follow up LABS/TEST: Gastric emptying study when feasible.   Follow-up Information    PCP. Schedule an appointment as soon as possible for a visit in 1 week(s).              Diet recommendation: Cardiac diet  Activity: The patient is advised to gradually reintroduce usual activities, as tolerated  Discharge Condition: stable  Code Status: Full code   History of present illness: As per the H and P dictated on admission, "Taylor [name] is a 34 y.o. female with medical history significant of recent COVID 19 infection, IDDM. Presents with a 2-day history of intractable N/V. She reports that she has generally felt tired since her release from the hospital for COVID PNA. However starting Friday, she began several episodes of nonbloody emesis. There was no accompanying abdominal pain or diarrhea. She tried using pepto bismol, but that did not provide relief. These episodes progressively worsened through morning. She reports compliance with her insulin. She reports nausea occurs w/ solids and liquid. She became concerned about her condition and came to the ED. "  Hospital Course:  Summary of her active problems in the hospital is as following. 1.  IDDM Severe hyperglycemia without DKA Suspecting gastroparesis Intractable nausea and vomiting. Constipation Presents with complaints of nausea and vomiting. Labs show hyperglycemia blood glucose in 213. Anion gap of 25. Bicarb of 20. Lipase normal. Urine had mild ketonuria.  No acidosis on venous blood gas.  Lactic acid was  normal. Patient was initially started on IV fluids as well as IV insulin with Endo tool protocol. As patient's repeat anion gap was closed.  IV insulin were discontinued.  Patient is currently on basal bolus regimen with Premeal coverage. Suspect that her presentation is secondary to undiagnosed gastroparesis.  Patient was treated with IV Reglan with improvement in symptoms as well as tolerating oral diet. We will prescribe Reglan for a few more days on discharge.  X-ray abdomen negative for any acute abnormality other than mild stool burden.  Constipation resolved in the hospital. We will continue bowel regimen.  2.  Concern for sepsis, ruled out. More likely SIRS in the setting of hyperglycemia and gastroparesis Presents with abdominal pain, nausea vomiting. On presentation tachycardic with severe leukocytosis and anion gap. UA had mild pyuria. Patient is started on IV vancomycin and cefepime with concern for sepsis. Sepsis ruled out. Procalcitonin negative x2. No antibiotics.  3.  Hypokalemia. Replacing potassium orally.  Anticipating improvement although the patient is tolerating oral diet without any vomiting.  4.  Polycythemia Likely dehydration. Monitor. Prior hemoglobin normal.  5.  Recent COVID-19 pneumonia. Patient was recently hospitalized and treated for COVID-19 pneumonia. Currently her presentation is not consistent with COVID-19 although still patient is within 21 days of initial diagnosis therefore will maintain isolation.  Patient was ambulatory without any assistance. On the day of the discharge the patient's vitals were stable, and no other acute medical condition were reported by patient. The patient was felt safe to be discharge at Home with no therapy needed on discharge.  Consultants: none Procedures: none  Discharge Exam: General: Appear in no distress, no Rash; Oral Mucosa Clear, moist. no Abnormal Neck Mass Or lumps, Conjunctiva normal   Cardiovascular: S1 and S2 Present, no Murmur Respiratory: good respiratory effort, Bilateral Air entry present and CTA, no Crackles, no wheezes Abdomen: Bowel Sound present, Soft and no tenderness Extremities: no Pedal edema Neurology: alert and oriented to time, place, and person affect appropriate. no new focal deficit  Filed Weights   05/27/20 0507  Weight: 54 kg   Vitals:   05/29/20 1502 05/29/20 1940  BP: (!) 140/98 (!) 146/96  Pulse: (!) 103 (!) 104  Resp: 20 18  Temp: 98.2 F (36.8 C) 98.5 F (36.9 C)  SpO2: 100% (!) 84%    DISCHARGE MEDICATION: Allergies as of 05/29/2020   No Known Allergies     Medication List    TAKE these medications   blood glucose meter kit and supplies Kit Dispense based on patient and insurance preference. Use up to four times daily as directed. (FOR ICD-9 250.00, 250.01).  Diagnosis: Insulin dependent Diabetes mellitus. ICD 10 code E11.9   famotidine 20 MG tablet Commonly known as: PEPCID Take 1 tablet (20 mg total) by mouth daily. Start taking on: May 30, 2020   insulin detemir 100 UNIT/ML FlexPen Commonly known as: LEVEMIR Inject 20 Units into the skin daily.   insulin lispro 200 UNIT/ML KwikPen Commonly known as: HUMALOG Inject 10 Units into the skin 3 (three) times daily before meals.   Insulin Pen Needle 31G X 6 MM Misc 1 Device by Does not apply route 2 (two) times daily.   metoCLOPramide 10 MG tablet Commonly known as: REGLAN Take 1 tablet (10 mg total) by mouth 3 (three) times daily before meals.   polyethylene glycol 17 g packet Commonly known as: MIRALAX / GLYCOLAX Take 17 g by mouth daily. Start taking on: May 30, 2020   simethicone 80 MG chewable tablet Commonly known as: MYLICON Chew 1 tablet (80 mg total) by mouth 4 (four) times daily as needed for flatulence.      No Known Allergies Discharge Instructions    Diet Carb Modified   Complete by: As directed    Increase activity slowly    Complete by: As directed       The results of significant diagnostics from this hospitalization (including imaging, microbiology, ancillary and laboratory) are listed below for reference.    Significant Diagnostic Studies: Abd 1 View (KUB)  Result Date: 05/27/2020 CLINICAL DATA:  Emesis for 3 days. EXAM: ABDOMEN - 1 VIEW COMPARISON:  None. FINDINGS: Normal bowel gas pattern. No bowel dilatation to suggest obstruction. No evidence of free air on this supine view. No radiopaque calculi or abnormal soft tissue calcifications. IUD in the pelvis. No concerning intraabdominal mass effect. Small volume of stool in the colon. No osseous abnormalities are seen. IMPRESSION: Unremarkable radiograph of the abdomen. Electronically Signed   By: Keith Rake M.D.   On: 05/27/2020 17:17   DG Chest Portable 1 View  Result Date: 05/27/2020 CLINICAL DATA:  Cough and emesis for 3 days EXAM: PORTABLE CHEST 1 VIEW COMPARISON:  05/10/2020 FINDINGS: Normal heart size and mediastinal contours. No acute infiltrate or edema. No effusion or pneumothorax. No acute osseous findings. IMPRESSION: Negative chest. Electronically Signed   By: Monte Fantasia M.D.   On: 05/27/2020 07:02   DG Chest Portable 1 View  Result Date: 05/10/2020 CLINICAL DATA:  COVID-19 positive, shortness of breath, weakness EXAM: PORTABLE CHEST 1 VIEW COMPARISON:  03/04/2016  FINDINGS: The heart size and mediastinal contours are within normal limits. Both lungs are clear. The visualized skeletal structures are unremarkable. IMPRESSION: No active disease. Electronically Signed   By: Randa Ngo M.D.   On: 05/10/2020 21:11    Microbiology: Recent Results (from the past 240 hour(s))  Blood culture (routine x 2)     Status: None (Preliminary result)   Collection Time: 05/27/20  7:26 AM   Specimen: BLOOD  Result Value Ref Range Status   Specimen Description   Final    BLOOD LEFT ANTECUBITAL Performed at [facility], [place] [address]  [place], [place] [address]    Special Requests   Final    BOTTLES DRAWN AEROBIC AND ANAEROBIC Blood Culture adequate volume Performed at [facility] [address], [place], [place] [address]    Culture   Final    NO GROWTH 2 DAYS Performed at [facility] [address] [place], [place] [address]    Report Status PENDING  Incomplete  Urine culture     Status: Abnormal   Collection Time: 05/27/20  7:26 AM   Specimen: Urine, Random  Result Value Ref Range Status   Specimen Description   Final    URINE, RANDOM Performed at [facility] [address], [place], [place] [address]    Special Requests   Final    NONE Performed at [facility], [place] [address], [place], [place] [address]    Culture MULTIPLE SPECIES PRESENT, SUGGEST RECOLLECTION (A)  Final   Report Status 05/29/2020 FINAL  Final  Blood culture (routine x 2)     Status: None (Preliminary result)   Collection Time: 05/27/20  8:00 AM   Specimen: BLOOD  Result Value Ref Range Status   Specimen Description   Final    BLOOD RIGHT ANTECUBITAL Performed at [facility], [place] [address], [place], [place] [address]    Special Requests   Final    BOTTLES DRAWN AEROBIC AND ANAEROBIC Blood Culture adequate volume Performed at [facility] [address] [place], [place] [address]    Culture   Final    NO GROWTH 2 DAYS Performed at [facility] [address] [place],  [address]    Report Status PENDING  Incomplete     Labs: CBC: Recent Labs  Lab 05/27/20 0516  WBC 28.8*  HGB 17.0*  HCT 48.7*  MCV 85.4  PLT 837*   Basic Metabolic Panel: Recent Labs  Lab 05/27/20 0516 05/27/20 1325 05/27/20 2000 05/28/20 0457 05/29/20 0422  NA 134* 134* 138 138 137  K 4.3 3.6 3.2* 3.0* 3.1*  CL 89* 99 102 104 101  CO2 20* _0 GLUCOSE 213* 190* 122* 91 75  BUN 59* 44* 29* 22* 7  CREATININE 1.16* 0.64 0.45 0.38*  <0.30*  CALCIUM 10.4* 8.1* 8.3* 7.9* 8.3*  MG  --   --   --  2.1  --    Liver Function Tests: Recent Labs  Lab 05/27/20 0516  AST 22  ALT 15  ALKPHOS 73  BILITOT 1.2  PROT 8.8*  ALBUMIN 4.2   CBG: Recent Labs  Lab 05/28/20 2157 05/28/20 2220 05/29/20 0734 05/29/20 1112 05/29/20 1611  GLUCAP 61* 118* 81 134* 158*    Time spent: 35 minutes  Signed:  Berle Mull  [facility] 05/29/2020 9:26 PM

## 2020-05-29 NOTE — Progress Notes (Addendum)
Pertinent dc instructions provided dc home via wheelchair, prior to 10pm meds

## 2020-05-29 NOTE — Progress Notes (Signed)
Pt is being dc'ed home with instructions and all questions answered.

## 2020-05-29 NOTE — Progress Notes (Signed)
Inpatient Diabetes Program Recommendations  AACE/ADA: New Consensus Statement on Inpatient Glycemic Control (2015)  Target Ranges:  Prepandial:   less than 140 mg/dL      Peak postprandial:   less than 180 mg/dL (1-2 hours)      Critically ill patients:  140 - 180 mg/dL   Lab Results  Component Value Date   GLUCAP 81 05/29/2020   HGBA1C 14.5 (H) 05/11/2020    Review of Glycemic Control  Diabetes history: DM2 Outpatient Diabetes medications: Levemir 20 units QD, Humalog 10 units tid Current orders for Inpatient glycemic control: Levemir 20 units QD, Novolog 0-15 units tidwc and hs  CBGs 75, 81 this am. Had low of 61 at HS last night. Only received Levemir 10 units 9/17 am.   Inpatient Diabetes Program Recommendations:     Watch glucose trends. May need to decrease Levemir slightly.   Follow closely.  Thank you. Ailene Ards, RD, LDN, CDE Inpatient Diabetes Coordinator [phone]

## 2020-05-31 LAB — BLOOD CULTURE ID PANEL (REFLEXED) - BCID2

## 2020-06-01 LAB — CULTURE, BLOOD (ROUTINE X 2)
Culture: NO GROWTH
Special Requests: ADEQUATE
Special Requests: ADEQUATE

## 2020-06-06 ENCOUNTER — Ambulatory Visit (INDEPENDENT_AMBULATORY_CARE_PROVIDER_SITE_OTHER): Payer: Managed Care, Other (non HMO) | Admitting: Primary Care

## 2020-06-06 ENCOUNTER — Other Ambulatory Visit: Payer: Self-pay

## 2020-06-06 ENCOUNTER — Encounter (INDEPENDENT_AMBULATORY_CARE_PROVIDER_SITE_OTHER): Payer: Self-pay | Admitting: Primary Care

## 2020-06-06 VITALS — BP 115/81 | HR 110 | Temp 97.7°F | Ht 60.0 in | Wt 143.6 lb

## 2020-06-06 DIAGNOSIS — Z1159 Encounter for screening for other viral diseases: Secondary | ICD-10-CM

## 2020-06-06 DIAGNOSIS — E785 Hyperlipidemia, unspecified: Secondary | ICD-10-CM | POA: Diagnosis not present

## 2020-06-06 DIAGNOSIS — E119 Type 2 diabetes mellitus without complications: Secondary | ICD-10-CM | POA: Diagnosis not present

## 2020-06-06 DIAGNOSIS — Z09 Encounter for follow-up examination after completed treatment for conditions other than malignant neoplasm: Secondary | ICD-10-CM

## 2020-06-06 DIAGNOSIS — Z7689 Persons encountering health services in other specified circumstances: Secondary | ICD-10-CM

## 2020-06-06 DIAGNOSIS — Z760 Encounter for issue of repeat prescription: Secondary | ICD-10-CM

## 2020-06-06 LAB — GLUCOSE, POCT (MANUAL RESULT ENTRY): POC Glucose: 76 mg/dl (ref 70–99)

## 2020-06-06 MED ORDER — ATORVASTATIN CALCIUM 40 MG PO TABS: 40.0000 mg | ORAL_TABLET | Freq: Every day | ORAL | 3 refills | Status: DC

## 2020-06-06 MED ORDER — INSULIN ASPART 100 UNIT/ML ~~LOC~~ SOLN: SUBCUTANEOUS | 5 refills | Status: DC

## 2020-06-06 MED ORDER — LISINOPRIL 2.5 MG PO TABS: 2.5000 mg | ORAL_TABLET | Freq: Every day | ORAL | 1 refills | Status: DC

## 2020-06-06 MED ORDER — BASAGLAR KWIKPEN 100 UNIT/ML ~~LOC~~ SOPN: 12.0000 [IU] | PEN_INJECTOR | Freq: Every day | SUBCUTANEOUS | 6 refills | Status: DC

## 2020-06-06 MED ORDER — JANUMET 50-1000 MG PO TABS: 1.0000 | ORAL_TABLET | Freq: Two times a day (BID) | ORAL | 0 refills | Status: DC

## 2020-06-06 NOTE — Patient Instructions (Signed)

## 2020-06-06 NOTE — Progress Notes (Signed)
New Patient Office Visit  Subjective:  Patient ID: Taylor [name], female    DOB: 1985-12-29  Age: 34 y.o. MRN: [id number]  CC:  Chief Complaint  Patient presents with  . New Patient (Initial Visit)    diabetes     HPI  Taylor [name] 34 y.o.female presents for follow up from the hospital. Admit date to the hospital was 05/27/20, patient was discharged from the hospital on 05/29/20, patient was admitted for: DKA (diabetic ketoacidoses) ([place]) and Pneumonia due to COVID-19 virus.  She is also establishing care .  She denies polyuria, polydipsia, and polyphagia.  Blood pressure is unremarkable.    Past Medical History:  Diagnosis Date  . Diabetes mellitus   . Gestational diabetes     Past Surgical History:  Procedure Laterality Date  . NO PAST SURGERIES      No family history on file.  Social History   Socioeconomic History  . Marital status: Single    Spouse name: Not on file  . Number of children: Not on file  . Years of education: Not on file  . Highest education level: Not on file  Occupational History  . Not on file  Tobacco Use  . Smoking status: Never Smoker  . Smokeless tobacco: Never Used  Substance and Sexual Activity  . Alcohol use: No  . Drug use: No  . Sexual activity: Yes  Other Topics Concern  . Not on file  Social History Narrative  . Not on file   Social Determinants of Health   Financial Resource Strain:   . Difficulty of Paying Living Expenses: Not on file  Food Insecurity:   . Worried About Charity fundraiser in the Last Year: Not on file  . Ran Out of Food in the Last Year: Not on file  Transportation Needs:   . Lack of Transportation (Medical): Not on file  . Lack of Transportation (Non-Medical): Not on file  Physical Activity:   . Days of Exercise per Week: Not on file  . Minutes of Exercise per Session: Not on file  Stress:   . Feeling of Stress : Not on file  Social Connections:   . Frequency of Communication with Friends and Family:  Not on file  . Frequency of Social Gatherings with Friends and Family: Not on file  . Attends Religious Services: Not on file  . Active Member of Clubs or Organizations: Not on file  . Attends Archivist Meetings: Not on file  . Marital Status: Not on file  Intimate Partner Violence:   . Fear of Current or Ex-Partner: Not on file  . Emotionally Abused: Not on file  . Physically Abused: Not on file  . Sexually Abused: Not on file    ROS Review of Systems all negative except above.    Objective:   Today's Vitals: BP 115/81 (BP Location: Right Arm, Patient Position: Sitting, Cuff Size: Normal)   Pulse (!) 110   Temp 97.7 F (36.5 C) (Temporal)   Ht 5' (1.524 m)   Wt 143 lb 9.6 oz (65.1 kg)   LMP 05/16/2020 (Approximate)   SpO2 100%   BMI 28.04 kg/m   Physical Exam General Appearance: Well nourished, in no apparent distress. Eyes: PERRLA, EOMs, conjunctiva no swelling or erythema Sinuses: No Frontal/maxillary tenderness ENT/Mouth: Ext aud canals clear, TMs without erythema, bulging. Marland Kitchen Hearing normal.  Neck: Supple, thyroid normal.  Respiratory: Respiratory effort normal, BS equal bilaterally without rales, rhonchi, wheezing or stridor.  Cardio:  RRR with no MRGs. Brisk peripheral pulses without edema.  Abdomen: Soft, + BS.  Non tender, no guarding, rebound, hernias, masses. Lymphatics: Non tender without lymphadenopathy.  Musculoskeletal: Full ROM, 5/5 strength, normal gait.  Skin: Warm, dry without rashes, lesions, ecchymosis.  Neuro: Cranial nerves intact. Normal muscle tone, no cerebellar symptoms. Sensation intact.  Psych: Awake and oriented X 3, normal affect, Insight and Judgment appropriate.    Outpatient Encounter Medications as of 06/06/2020  Medication Sig  . blood glucose meter kit and supplies KIT Dispense based on patient and insurance preference. Use up to four times daily as directed. (FOR ICD-9 250.00, 250.01).  Diagnosis: Insulin dependent  Diabetes mellitus. ICD 10 code E11.9  . famotidine (PEPCID) 20 MG tablet Take 1 tablet (20 mg total) by mouth daily.  . Insulin Pen Needle 31G X 6 MM MISC 1 Device by Does not apply route 2 (two) times daily.  . [DISCONTINUED] insulin detemir (LEVEMIR) 100 UNIT/ML FlexPen Inject 20 Units into the skin daily.  . [DISCONTINUED] insulin lispro (HUMALOG) 200 UNIT/ML KwikPen Inject 10 Units into the skin 3 (three) times daily before meals.  Marland Kitchen atorvastatin (LIPITOR) 40 MG tablet Take 1 tablet (40 mg total) by mouth daily.  . insulin aspart (NOVOLOG) 100 UNIT/ML injection For blood sugars 0-150 give 0 units of insulin, 200-250  give 4 units of insulin, 251-300 give 6 units, 301-350 give 8 units, 351-400 give 10 units, 401-450 give 12  units,> 400  Call PCP Discussed hypoglycemia protocol.  . Insulin Glargine (BASAGLAR KWIKPEN) 100 UNIT/ML Inject 12 Units into the skin daily.  Marland Kitchen lisinopril (ZESTRIL) 2.5 MG tablet Take 1 tablet (2.5 mg total) by mouth daily.  . metoCLOPramide (REGLAN) 10 MG tablet Take 1 tablet (10 mg total) by mouth 3 (three) times daily before meals. (Patient not taking: Reported on 06/06/2020)  . polyethylene glycol (MIRALAX / GLYCOLAX) 17 g packet Take 17 g by mouth daily. (Patient not taking: Reported on 06/06/2020)  . simethicone (MYLICON) 80 MG chewable tablet Chew 1 tablet (80 mg total) by mouth 4 (four) times daily as needed for flatulence. (Patient not taking: Reported on 06/06/2020)  . sitaGLIPtin-metformin (JANUMET) 50-1000 MG tablet Take 1 tablet by mouth 2 (two) times daily with a meal.   No facility-administered encounter medications on file as of 06/06/2020.  Taylor [name] was seen today for new patient (initial visit).  Diagnoses and all orders for this visit:  Type 2 diabetes mellitus without complication, without long-term current use of insulin (HCC) A1c 14.5 : Goal of therapy: Less than 6.5 hemoglobin A1c. Decrease foods that are high in carbohydrates are the following rice,  potatoes, breads, sugars, and pastas.  Reduction in the intake (eating) will assist in lowering your blood sugars. Medications adjusted to include Janumet 50/1000 twice daily Basaglar KwikPen 12 units at bedtime/daily.  Discontinued Levemir.  Adjusted sliding scale NovoLog -     Glucose (CBG) -     Microalbumin/Creatinine Ratio, Urine -     sitaGLIPtin-metformin (JANUMET) 50-1000 MG tablet; Take 1 tablet by mouth 2 (two) times daily with a meal. -     Insulin Glargine (BASAGLAR KWIKPEN) 100 UNIT/ML; Inject 12 Units into the skin daily. -     insulin aspart (NOVOLOG) 100 UNIT/ML injection; For blood sugars 0-150 give 0 units of insulin, 200-250  give 4 units of insulin, 251-300 give 6 units, 301-350 give 8 units, 351-400 give 10 units, 401-450 give 12  units,> 400  Call PCP Discussed hypoglycemia protocol. -  lisinopril (ZESTRIL) 2.5 MG tablet; Take 1 tablet (2.5 mg total) by mouth daily. -     CBC with Differential -     CMP14+EGFR  Elevated lipids -     atorvastatin (LIPITOR) 40 MG tablet; Take 1 tablet (40 mg total) by mouth daily.  Hospital discharge follow-up -     CBC with Differential -     CMP14+EGFR  Encounter to establish care Juluis Mire, NP-C will be your  (PCP) she is mastered prepared . She is skilled to diagnosed and treat illness. Also able to answer health concern as well as continuing care of varied medical conditions, not limited by cause, organ system, or diagnosis.   Medication refill -     insulin aspart (NOVOLOG) 100 UNIT/ML injection; For blood sugars 0-150 give 0 units of insulin, 200-250  give 4 units of insulin, 251-300 give 6 units, 301-350 give 8 units, 351-400 give 10 units, 401-450 give 12  units,> 400  Call PCP Discussed hypoglycemia protocol.  Encounter for hepatitis C screening test for low risk patient Preventive care, health maintenance and care gap-     Hepatitis C Antibody    Follow-up: Return in about 2 months (around 08/06/2020) for Diabetes  /labs .   Kerin Perna, NP

## 2020-06-07 LAB — MICROALBUMIN / CREATININE URINE RATIO
Creatinine, Urine: 26 mg/dL
Microalb/Creat Ratio: 41 mg/g creat — ABNORMAL HIGH (ref 0–29)
Microalbumin, Urine: 10.6 ug/mL

## 2020-06-07 LAB — CBC WITH DIFFERENTIAL/PLATELET
Basophils Absolute: 0.1 10*3/uL (ref 0.0–0.2)
Basos: 0 %
EOS (ABSOLUTE): 0.3 10*3/uL (ref 0.0–0.4)
Eos: 2 %
Hematocrit: 32.5 % — ABNORMAL LOW (ref 34.0–46.6)
Hemoglobin: 10.3 g/dL — ABNORMAL LOW (ref 11.1–15.9)
Immature Grans (Abs): 0.2 10*3/uL — ABNORMAL HIGH (ref 0.0–0.1)
Immature Granulocytes: 2 %
Lymphocytes Absolute: 4.8 10*3/uL — ABNORMAL HIGH (ref 0.7–3.1)
Lymphs: 33 %
MCH: 29.3 pg (ref 26.6–33.0)
MCHC: 31.7 g/dL (ref 31.5–35.7)
MCV: 93 fL (ref 79–97)
Monocytes Absolute: 0.8 10*3/uL (ref 0.1–0.9)
Monocytes: 6 %
Neutrophils Absolute: 8.2 10*3/uL — ABNORMAL HIGH (ref 1.4–7.0)
Neutrophils: 57 %
Platelets: 495 10*3/uL — ABNORMAL HIGH (ref 150–450)
RBC: 3.51 x10E6/uL — ABNORMAL LOW (ref 3.77–5.28)
RDW: 12.9 % (ref 11.7–15.4)
WBC: 14.5 10*3/uL — ABNORMAL HIGH (ref 3.4–10.8)

## 2020-06-07 LAB — CMP14+EGFR
ALT: 10 IU/L (ref 0–32)
AST: 8 IU/L (ref 0–40)
Albumin/Globulin Ratio: 1.8 (ref 1.2–2.2)
Albumin: 4.1 g/dL (ref 3.8–4.8)
Alkaline Phosphatase: 57 IU/L (ref 44–121)
BUN/Creatinine Ratio: 16 (ref 9–23)
BUN: 8 mg/dL (ref 6–20)
Bilirubin Total: <0.2 mg/dL (ref 0.0–1.2)
CO2: 25 mmol/L (ref 20–29)
Calcium: 9.7 mg/dL (ref 8.7–10.2)
Chloride: 100 mmol/L (ref 96–106)
Creatinine, Ser: 0.51 mg/dL — ABNORMAL LOW (ref 0.57–1.00)
GFR calc Af Amer: 148 mL/min/{1.73_m2} (ref >59)
GFR calc non Af Amer: 129 mL/min/{1.73_m2} (ref >59)
Globulin, Total: 2.3 g/dL (ref 1.5–4.5)
Glucose: 57 mg/dL — ABNORMAL LOW (ref 65–99)
Potassium: 4 mmol/L (ref 3.5–5.2)
Sodium: 139 mmol/L (ref 134–144)
Total Protein: 6.4 g/dL (ref 6.0–8.5)

## 2020-06-07 LAB — HEPATITIS C ANTIBODY: Hep C Virus Ab: <0.1 s/co ratio (ref 0.0–0.9)

## 2020-06-18 ENCOUNTER — Other Ambulatory Visit: Payer: Self-pay | Admitting: Primary Care

## 2020-06-18 ENCOUNTER — Telehealth (INDEPENDENT_AMBULATORY_CARE_PROVIDER_SITE_OTHER): Payer: Self-pay | Admitting: Primary Care

## 2020-06-18 MED ORDER — SITAGLIPTIN PHOSPHATE 100 MG PO TABS: 100.0000 mg | ORAL_TABLET | Freq: Every day | ORAL | 1 refills | Status: DC

## 2020-06-18 MED ORDER — METFORMIN HCL 1000 MG PO TABS: 1000.0000 mg | ORAL_TABLET | Freq: Two times a day (BID) | ORAL | 3 refills | Status: DC

## 2020-06-18 NOTE — Telephone Encounter (Signed)
insulin aspart (NOVOLOG) 100 UNIT/ML injection Medication Date: 06/06/2020 Department: [facility] Ordering/Authorizing: Grayce Sessions, NP   sitaGLIPtin-metformin (JANUMET) 50-1000 MG tablet Medication Date: 06/06/2020 Department: [facility] Ordering/Authorizing: Grayce Sessions, NP  Order Providers  [facility] [phone] - [place], [place] - [address] AT [address] [facility] [address] [place] Phone:  [phone]  Fax:  [phone]     Please send another script to sub for these two above. Pt insurance will not cover these.

## 2020-06-18 NOTE — Telephone Encounter (Signed)
Please send another script to sub for these two above. Pt insurance will not cover these.    There are no medications in this encounter.

## 2020-06-18 NOTE — Telephone Encounter (Signed)
Sent to PCP ?

## 2020-06-19 ENCOUNTER — Other Ambulatory Visit: Payer: Self-pay | Admitting: Primary Care

## 2020-06-19 DIAGNOSIS — Z760 Encounter for issue of repeat prescription: Secondary | ICD-10-CM

## 2020-06-19 DIAGNOSIS — E119 Type 2 diabetes mellitus without complications: Secondary | ICD-10-CM

## 2020-06-19 NOTE — Telephone Encounter (Signed)
Discontinue sliding scale insulin due to insurance . change janume to januvia 100 mg daily and metformin 1000mg  twice daily

## 2020-06-19 NOTE — Telephone Encounter (Signed)
Please look again, this patient is not on either of those medications. It is possible you have addressed another patients issue in this patients chart.

## 2020-08-06 ENCOUNTER — Ambulatory Visit (INDEPENDENT_AMBULATORY_CARE_PROVIDER_SITE_OTHER): Payer: Managed Care, Other (non HMO) | Admitting: Primary Care

## 2021-04-02 ENCOUNTER — Other Ambulatory Visit: Payer: Self-pay

## 2021-04-02 ENCOUNTER — Ambulatory Visit (INDEPENDENT_AMBULATORY_CARE_PROVIDER_SITE_OTHER): Payer: Self-pay | Admitting: Primary Care

## 2021-04-02 VITALS — Ht 61.0 in

## 2021-04-02 DIAGNOSIS — Z760 Encounter for issue of repeat prescription: Secondary | ICD-10-CM

## 2021-04-02 DIAGNOSIS — I10 Essential (primary) hypertension: Secondary | ICD-10-CM

## 2021-04-02 DIAGNOSIS — E119 Type 2 diabetes mellitus without complications: Secondary | ICD-10-CM

## 2021-04-02 DIAGNOSIS — E785 Hyperlipidemia, unspecified: Secondary | ICD-10-CM

## 2021-04-02 MED ORDER — METFORMIN HCL 1000 MG PO TABS
1000.0000 mg | ORAL_TABLET | Freq: Two times a day (BID) | ORAL | 3 refills | Status: AC
  Filled 2021-04-02: qty 60, 30d supply, fill #0

## 2021-04-02 MED ORDER — ATORVASTATIN CALCIUM 40 MG PO TABS
40.0000 mg | ORAL_TABLET | Freq: Every day | ORAL | 0 refills | Status: AC
  Filled 2021-04-02: qty 30, 30d supply, fill #0

## 2021-04-02 MED ORDER — LISINOPRIL 2.5 MG PO TABS
2.5000 mg | ORAL_TABLET | Freq: Every day | ORAL | 1 refills | Status: AC
  Filled 2021-04-02: qty 30, 30d supply, fill #0

## 2021-04-02 MED ORDER — BASAGLAR KWIKPEN 100 UNIT/ML ~~LOC~~ SOPN
14.0000 [IU] | PEN_INJECTOR | Freq: Every day | SUBCUTANEOUS | 6 refills | Status: AC
  Filled 2021-04-02: qty 3, 21d supply, fill #0

## 2021-04-02 MED ORDER — TRULICITY 0.75 MG/0.5ML ~~LOC~~ SOAJ
0.7500 mg | SUBCUTANEOUS | 3 refills | Status: AC
  Filled 2021-04-02: qty 2, 28d supply, fill #0

## 2021-04-02 NOTE — Patient Instructions (Signed)
Dulaglutide Injection What is this medication? DULAGLUTIDE (DOO la GLOO tide) controls blood sugar in people with type 2 diabetes. It is used with lifestyle changes like diet and exercise. It may lower the risk of problems that need treatment in the hospital. These problemsinclude heart attack or stroke. This medicine may be used for other purposes; ask your health care provider orpharmacist if you have questions. COMMON BRAND NAME(S): Trulicity What should I tell my care team before I take this medication? They need to know if you have any of these conditions: endocrine tumors (MEN 2) or if someone in your family had these tumors eye disease, vision problems history of pancreatitis kidney disease liver disease stomach or intestine problems thyroid cancer or if someone in your family had thyroid cancer an unusual or allergic reaction to dulaglutide, other medicines, foods, dyes, or preservatives pregnant or trying to get pregnant breast-feeding How should I use this medication? This medicine is injected under the skin. You will be taught how to prepare and give it. Take it as directed on the prescription label on the same day of each week. Do NOT prime the pen. Keep taking it unless your health care providertells you to stop. If you use this medicine with insulin, you should inject this medicine and the insulin separately. Do not mix them together. Do not give the injections rightnext to each other. Change (rotate) injection sites with each injection. This drug comes with INSTRUCTIONS FOR USE. Ask your pharmacist for directions on how to use this medicine. Read the information carefully. Talk to yourpharmacist or health care provider if you have questions. It is important that you put your used needles and syringes in a special sharps container. Do not put them in a trash can. If you do not have a sharpscontainer, call your pharmacist or health care provider to get one. A special MedGuide will  be given to you by the pharmacist with eachprescription and refill. Be sure to read this information carefully each time. Talk to your health care provider about the use of this medicine in children.Special care may be needed. Overdosage: If you think you have taken too much of this medicine contact apoison control center or emergency room at once. NOTE: This medicine is only for you. Do not share this medicine with others. What if I miss a dose? If you miss a dose, take it as soon as you can unless it is more than 3 days late. If it is more than 3 days late, skip the missed dose. Take the next doseat the normal time. What may interact with this medication? other medicines for diabetes Many medications may cause changes in blood sugar, these include: alcohol containing beverages antiviral medicines for HIV or AIDS aspirin and aspirin-like drugs certain medicines for blood pressure, heart disease, irregular heart beat chromium diuretics female hormones, such as estrogens or progestins, birth control pills fenofibrate gemfibrozil isoniazid lanreotide female hormones or anabolic steroids MAOIs like Carbex, Eldepryl, Marplan, Nardil, and Parnate medicines for allergies, asthma, cold, or cough medicines for depression, anxiety, or psychotic disturbances medicines for weight loss niacin nicotine NSAIDs, medicines for pain and inflammation, like ibuprofen or naproxen octreotide pasireotide pentamidine phenytoin probenecid quinolone antibiotics such as ciprofloxacin, levofloxacin, ofloxacin some herbal dietary supplements steroid medicines such as prednisone or cortisone sulfamethoxazole; trimethoprim thyroid hormones Some medications can hide the warning symptoms of low blood sugar (hypoglycemia). You may need to monitor your blood sugar more closely if youare taking one of these medications. These   include: beta-blockers, often used for high blood pressure or heart problems (examples  include atenolol, metoprolol, propranolol) clonidine guanethidine reserpine This list may not describe all possible interactions. Give your health care provider a list of all the medicines, herbs, non-prescription drugs, or dietary supplements you use. Also tell them if you smoke, drink alcohol, or use illegaldrugs. Some items may interact with your medicine. What should I watch for while using this medication? Visit your health care provider for regular checks on your progress. Check with your health care provider if you have severe diarrhea, nausea, and vomiting, or if you sweat a lot. The loss of too much body fluid may make itdangerous for you to take this medicine. A test called the HbA1C (A1C) will be monitored. This is a simple blood test. It measures your blood sugar control over the last 2 to 3 months. You willreceive this test every 3 to 6 months. Learn how to check your blood sugar. Learn the symptoms of low and high bloodsugar and how to manage them. Always carry a quick-source of sugar with you in case you have symptoms of low blood sugar. Examples include hard sugar candy or glucose tablets. Make sure others know that you can choke if you eat or drink when you develop serious symptoms of low blood sugar, such as seizures or unconsciousness. Get medicalhelp at once. Tell your health care provider if you have high blood sugar. You might need to change the dose of your medicine. If you are sick or exercising more thanusual, you may need to change the dose of your medicine. Do not skip meals. Ask your health care provider if you should avoid alcohol. Many nonprescription cough and cold products contain sugar or alcohol. Thesecan affect blood sugar. Pens should never be shared. Even if the needle is changed, sharing may resultin passing of viruses like hepatitis or HIV. Wear a medical ID bracelet or chain. Carry a card that describes yourcondition. List the medicines and doses you take on the  card. What side effects may I notice from receiving this medication? Side effects that you should report to your doctor or health care professionalas soon as possible: allergic reactions (skin rash, itching or hives; swelling of the face, lips, or tongue) changes in vision diarrhea that continues or is severe infection (fever, chills, cough, sore throat, pain or trouble passing urine) kidney injury (trouble passing urine or change in the amount of urine) low blood sugar (feeling anxious; confusion; dizziness; increased hunger; unusually weak or tired; increased sweating; shakiness; cold, clammy skin; irritable; headache; blurred vision; fast heartbeat; loss of consciousness) lump or swelling on the neck trouble breathing trouble swallowing unusual stomach upset or pain vomiting Side effects that usually do not require medical attention (report to yourdoctor or health care professional if they continue or are bothersome): lack or loss of appetite nausea pain, redness, or irritation at site where injected This list may not describe all possible side effects. Call your doctor for medical advice about side effects. You may report side effects to FDA at1-800-FDA-1088. Where should I keep my medication? Keep out of the reach of children and pets. Refrigeration (preferred): Store unopened pens in a refrigerator between 2 and 8 degrees C (36 and 46 degrees F). Keep it in the original carton until you are ready to take it. Do not freeze or use if the medicine has been frozen. Protect from light. Get rid of any unused medicine after the expiration date on thelabel. Room Temperature: The   pen may be stored at room temperature below 30 degrees C (86 degrees F) for up to a total of 14 days if needed. Protect from light. Avoid exposure to extreme heat. If it is stored at room temperature, throw awayany unused medicine after 14 days or after it expires, whichever is first. To get rid of medicines that are no  longer needed or have expired: Take the medicine to a medicine take-back program. Check with your pharmacy or law enforcement to find a location. If you cannot return the medicine, ask your pharmacist or health care provider how to get rid of this medicine safely. NOTE: This sheet is a summary. It may not cover all possible information. If you have questions about this medicine, talk to your doctor, pharmacist, orhealth care provider.  2022 [employer]vier/Gold Standard (2020-06-18 07:35:51)  

## 2021-04-02 NOTE — Progress Notes (Signed)
Subjective:  Patient ID: Taylor [name], female    DOB: 1986-04-30  Age: 35 y.o. MRN: [id number]  CC: No chief complaint on file.   HPI Taylor [name] presents for Follow-up of diabetes. Patient does not check blood sugar at home  Compliant with meds - no Checking CBGs? No  Fasting avg -   Postprandial average -  Exercising regularly? - Yes Watching carbohydrate intake? - Yes Neuropathy ? - No Hypoglycemic events - No  - Recovers with :   Pertinent ROS:  Polyuria - No Polydipsia - No Vision problems - No  Medications as noted below. Taking them regularly without complication/adverse reaction being reported today.   History Taylor [name] has a past medical history of Diabetes mellitus and Gestational diabetes.   She has a past surgical history that includes No past surgeries.   Her family history is not on file.She reports that she has never smoked. She has never used smokeless tobacco. She reports that she does not drink alcohol and does not use drugs.  Current Outpatient Medications on File Prior to Visit  Medication Sig Dispense Refill   blood glucose meter kit and supplies KIT Dispense based on patient and insurance preference. Use up to four times daily as directed. (FOR ICD-9 250.00, 250.01).  Diagnosis: Insulin dependent Diabetes mellitus. ICD 10 code E11.9 1 each 0   famotidine (PEPCID) 20 MG tablet Take 1 tablet (20 mg total) by mouth daily. 30 tablet 0   Insulin Pen Needle 31G X 6 MM MISC 1 Device by Does not apply route 2 (two) times daily. 60 each 3   metoCLOPramide (REGLAN) 10 MG tablet Take 1 tablet (10 mg total) by mouth 3 (three) times daily before meals. (Patient not taking: Reported on 06/06/2020) 30 tablet 0   polyethylene glycol (MIRALAX / GLYCOLAX) 17 g packet Take 17 g by mouth daily. (Patient not taking: Reported on 06/06/2020) 14 each 0   simethicone (MYLICON) 80 MG chewable tablet Chew 1 tablet (80 mg total) by mouth 4 (four) times daily as needed for flatulence.  (Patient not taking: Reported on 06/06/2020) 30 tablet 0   No current facility-administered medications on file prior to visit.    ROS Review of Systems  All other systems reviewed and are negative.  Objective:  Ht '5\' 1"'  (1.549 m)   BMI 27.13 kg/m   BP Readings from Last 3 Encounters:  06/06/20 115/81  05/29/20 (!) 146/96  05/15/20 (!) 155/92    Wt Readings from Last 3 Encounters:  06/06/20 143 lb 9.6 oz (65.1 kg)  05/27/20 119 lb (54 kg)  05/10/20 119 lb 14.9 oz (54.4 kg)    Physical Exam Vitals:   04/02/21 1135  Height: '5\' 1"'  (1.549 m)   General: Vital signs reviewed.  Patient is well-developed and well-nourished, in no acute distress and cooperative with exam.  Head: Normocephalic and atraumatic. Eyes: EOMI, conjunctivae normal, no scleral icterus.  Neck: Supple, trachea midline, normal ROM, no JVD, masses, thyromegaly, or carotid bruit present.  Cardiovascular: RRR, S1 normal, S2 normal, no murmurs, gallops, or rubs. Pulmonary/Chest: Clear to auscultation bilaterally, no wheezes, rales, or rhonchi. Abdominal: Soft, non-tender, non-distended, BS +, no masses, organomegaly, or guarding present.  Musculoskeletal: No joint deformities, erythema, or stiffness, ROM full and nontender. Extremities: No lower extremity edema bilaterally,  pulses symmetric and intact bilaterally. No cyanosis or clubbing. Neurological: A&O x3, Strength is normal and symmetric bilaterally, cranial nerve II-XII are grossly intact, no focal motor deficit, sensory intact to light  touch bilaterally.  Skin: Warm, dry and intact. No rashes or erythema. Psychiatric: Normal mood and affect. speech and behavior is normal. Cognition and memory are normal.   Lab Results  Component Value Date   HGBA1C 14.5 (H) 05/11/2020   HGBA1C 14.3 (H) 03/04/2016   HGBA1C 12.9 11/20/2009    Lab Results  Component Value Date   WBC 14.5 (H) 06/06/2020   HGB 10.3 (L) 06/06/2020   HCT 32.5 (L) 06/06/2020   PLT 495  (H) 06/06/2020   GLUCOSE 57 (L) 06/06/2020   CHOL 301 (H) 09/24/2009   TRIG 280 (H) 05/10/2020   HDL 42 09/24/2009   LDLCALC 213 (H) 09/24/2009   ALT 10 06/06/2020   AST 8 06/06/2020   NA 139 06/06/2020   K 4.0 06/06/2020   CL 100 06/06/2020   CREATININE 0.51 (L) 06/06/2020   BUN 8 06/06/2020   CO2 25 06/06/2020   INR 1.2 05/28/2020   HGBA1C 14.5 (H) 05/11/2020     Assessment & Plan:  Diagnoses and all orders for this visit:  Medication refill -     Insulin Glargine (BASAGLAR KWIKPEN) 100 UNIT/ML; Inject 14 Units into the skin daily. -     metFORMIN (GLUCOPHAGE) 1000 MG tablet; Take 1 tablet (1,000 mg total) by mouth 2 (two) times daily with a meal.  Type 2 diabetes mellitus without complication, without long-term current use of insulin (HCC) ADA recommends the following therapeutic goals for glycemic control related to A1c measurements: Goal of therapy: Less than 6.5 hemoglobin A1c.  Reference clinical practice recommendations. Foods that are high in carbohydrates are the following rice, potatoes, breads, sugars, and pastas.  Reduction in the intake (eating) will assist in lowering your blood sugars.  -     Dulaglutide (TRULICITY) 0.35 DH/7.4BU SOPN; Inject 0.75 mg into the skin once a week. -     Insulin Glargine (BASAGLAR KWIKPEN) 100 UNIT/ML; Inject 14 Units into the skin daily. -     lisinopril (ZESTRIL) 2.5 MG tablet; Take 1 tablet (2.5 mg total) by mouth daily. -     metFORMIN (GLUCOPHAGE) 1000 MG tablet; Take 1 tablet (1,000 mg total) by mouth 2 (two) times daily with a meal.  Essential hypertension Renal protection -     lisinopril (ZESTRIL) 2.5 MG tablet; Take 1 tablet (2.5 mg total) by mouth daily.  Elevated lipids  Healthy lifestyle diet of fruits vegetables fish nuts whole grains and low saturated fat . Foods high in cholesterol or liver, fatty meats,cheese, butter avocados, nuts and seeds, chocolate and fried foods.  -     atorvastatin (LIPITOR) 40 MG tablet;  Take 1 tablet (40 mg total) by mouth daily.   I have discontinued Taylor [name]'s sitaGLIPtin. I have also changed her [place]. Additionally, I am having her start on Trulicity. Lastly, I am having her maintain her blood glucose meter kit and supplies, Insulin Pen Needle, famotidine, metoCLOPramide, polyethylene glycol, simethicone, lisinopril, metFORMIN, and atorvastatin.      Follow-up:   Return in about 4 weeks (around 04/30/2021) for Taylor [name]/ 54month.  The above assessment and management plan was discussed with the patient. The patient verbalized understanding of and has agreed to the management plan. Patient is aware to call the clinic if symptoms fail to improve or worsen. Patient is aware when to return to the clinic for a follow-up visit. Patient educated on when it is appropriate to go to the emergency department.   MJuluis Mire NP-C

## 2021-04-09 ENCOUNTER — Other Ambulatory Visit: Payer: Self-pay

## 2022-03-17 ENCOUNTER — Other Ambulatory Visit: Payer: Self-pay
